# Patient Record
Sex: Female | Born: 1965 | Race: White | Hispanic: No | Smoking: Current every day smoker
Health system: Southern US, Community
[De-identification: ages and names within clinical notes are randomized; demographics above are authoritative.]

## PROBLEM LIST (undated history)

## (undated) DIAGNOSIS — Z8489 Family history of other specified conditions: Secondary | ICD-10-CM

## (undated) DIAGNOSIS — J449 Chronic obstructive pulmonary disease, unspecified: Secondary | ICD-10-CM

## (undated) DIAGNOSIS — K649 Unspecified hemorrhoids: Secondary | ICD-10-CM

## (undated) DIAGNOSIS — E079 Disorder of thyroid, unspecified: Secondary | ICD-10-CM

## (undated) DIAGNOSIS — J45909 Unspecified asthma, uncomplicated: Secondary | ICD-10-CM

## (undated) HISTORY — DX: Chronic obstructive pulmonary disease, unspecified: J44.9

## (undated) HISTORY — PX: TONSILLECTOMY: SUR1361

## (undated) HISTORY — DX: Unspecified hemorrhoids: K64.9

## (undated) HISTORY — DX: Unspecified asthma, uncomplicated: J45.909

## (undated) HISTORY — DX: Disorder of thyroid, unspecified: E07.9

---

## 2006-06-09 ENCOUNTER — Emergency Department: Payer: Self-pay | Admitting: Unknown Physician Specialty

## 2012-08-02 ENCOUNTER — Emergency Department: Payer: Self-pay | Admitting: Emergency Medicine

## 2012-08-02 LAB — CBC WITH DIFFERENTIAL/PLATELET
Eosinophil %: 0.5 %
Lymphocyte %: 18.3 %
MCH: 30.5 pg (ref 26.0–34.0)
MCV: 89 fL (ref 80–100)
Monocyte #: 0.1 x10 3/mm — ABNORMAL LOW (ref 0.2–0.9)
Monocyte %: 2.4 %
Neutrophil #: 4 10*3/uL (ref 1.4–6.5)
RBC: 5.14 10*6/uL (ref 3.80–5.20)
RDW: 14.4 % (ref 11.5–14.5)
WBC: 5.2 10*3/uL (ref 3.6–11.0)

## 2016-07-22 DIAGNOSIS — K649 Unspecified hemorrhoids: Secondary | ICD-10-CM

## 2016-07-22 HISTORY — DX: Unspecified hemorrhoids: K64.9

## 2017-02-26 ENCOUNTER — Emergency Department
Admission: EM | Admit: 2017-02-26 | Discharge: 2017-02-26 | Disposition: A | Discharge status: Home / Self Care | Payer: Self-pay | Attending: Emergency Medicine | Admitting: Emergency Medicine

## 2017-02-26 ENCOUNTER — Encounter: Payer: Self-pay | Admitting: *Deleted

## 2017-02-26 DIAGNOSIS — F17210 Nicotine dependence, cigarettes, uncomplicated: Secondary | ICD-10-CM | POA: Insufficient documentation

## 2017-02-26 DIAGNOSIS — K642 Third degree hemorrhoids: Secondary | ICD-10-CM | POA: Insufficient documentation

## 2017-02-26 MED ORDER — HYDROCORTISONE ACETATE 25 MG RE SUPP: 25.0000 mg | Freq: Two times a day (BID) | RECTAL | 1 refills | Status: DC

## 2017-02-26 MED ORDER — DIBUCAINE 1 % RE OINT: 1.0000 "application " | TOPICAL_OINTMENT | Freq: Three times a day (TID) | RECTAL | 0 refills | Status: DC | PRN

## 2017-02-26 MED ORDER — LIDOCAINE HCL 2 % EX GEL: 1.0000 "application " | Freq: Once | CUTANEOUS | Status: DC

## 2017-02-26 MED ORDER — HYDROCODONE-ACETAMINOPHEN 7.5-325 MG PO TABS
1.0000 | ORAL_TABLET | Freq: Once | ORAL | Status: AC
  Administered 2017-02-26: 1 via ORAL
  Filled 2017-02-26: qty 1

## 2017-02-26 MED ORDER — HYDROCORTISONE ACETATE 25 MG RE SUPP
25.0000 mg | Freq: Once | RECTAL | Status: AC
  Administered 2017-02-26: 25 mg via RECTAL
  Filled 2017-02-26: qty 1

## 2017-02-26 MED ORDER — HYDROCODONE-ACETAMINOPHEN 5-325 MG PO TABS: 1.0000 | ORAL_TABLET | Freq: Three times a day (TID) | ORAL | 0 refills | Status: DC | PRN

## 2017-02-26 MED ORDER — LIDOCAINE HCL 2 % EX GEL
CUTANEOUS | Status: AC
  Administered 2017-02-26: 11:00:00
  Filled 2017-02-26: qty 10

## 2017-02-26 NOTE — Discharge Instructions (Signed)
You have both internal and external hemorrhoids. Take the pain medicine as needed. Use the suppository as directed and the topical gel as directed. Continue with sitz baths and consider using a foot stool to raise the legs when on the toilet. Start a daily fiber supplement (Fibercon, Metamucil, etc) as well as daily Miralax (polyethylene glycol) to keep stools soft. Limit your time sitting on they toilet. Follow-up with Dr. Vicente Males for possible non-surgical options.

## 2017-02-26 NOTE — ED Provider Notes (Signed)
[facility] Emergency Department Provider Note ____________________________________________  Time seen: 1008  I have reviewed the triage vital signs and the nursing notes.  HISTORY  Chief Complaint  Hemorrhoids  HPI Dominique Avila is a 51 y.o. female resents to the ED for evaluation and management of hemorrhoids. Patient reports a current flare for hemorrhoids over the last several days. Over the last 20+ years she has attempted to manage her hemorrhoid pain with over-the-counter medications, sitz baths, and suppositories. She denies any meaningful, lasting benefit. She presents now with pain, pressure, and burning to the hemorrhoids. She also notes discomfort with trying to sit down. She denies any gross blood at this time. She is aware one hemorrhoid that is tender and looks to be inflamed. She denies any nausea, vomiting, or diarrhea. She does admit at times to straining to pass stool. She denies any other significant medical history denies any current medications and denies any medical allergies.  History reviewed. No pertinent past medical history.  There are no active problems to display for this patient.   History reviewed. No pertinent surgical history.  Prior to Admission medications   Medication Sig Start Date End Date Taking? Authorizing Provider  dibucaine (NUPERCAINAL) 1 % OINT Place 1 application rectally 3 (three) times daily as needed for hemorrhoids. 02/26/17   Carson Bogden, Dannielle Karvonen, PA-C  HYDROcodone-acetaminophen (NORCO) 5-325 MG tablet Take 1 tablet by mouth 3 (three) times daily as needed. 02/26/17   Chequita Mofield, Dannielle Karvonen, PA-C  hydrocortisone (ANUSOL-HC) 25 MG suppository Place 1 suppository (25 mg total) rectally every 12 (twelve) hours. 02/26/17 03/10/17  Brandyn Thien, Dannielle Karvonen, PA-C    Allergies Patient has no known allergies.  No family history on file.  Social History Social History  Substance Use Topics  . Smoking status: Current  Every Day Smoker  . Smokeless tobacco: Never Used  . Alcohol use No    Review of Systems  Constitutional: Negative for fever. Cardiovascular: Negative for chest pain. Respiratory: Negative for shortness of breath. Gastrointestinal: Negative for abdominal pain, vomiting and diarrhea. Rectal pain as above  Genitourinary: Negative for dysuria. Skin: Negative for rash. ____________________________________________  PHYSICAL EXAM:  VITAL SIGNS: ED Triage Vitals [02/26/17 0922]  Enc Vitals Group     BP (!) 136/92     Pulse Rate 80     Resp 18     Temp 97.8 F (36.6 C)     Temp Source Oral     SpO2 97 %     Weight 190 lb (86.2 kg)     Height 5\' 9"  (1.753 m)     Head Circumference      Peak Flow      Pain Score 10     Pain Loc      Pain Edu?      Excl. in [place]?     Constitutional: Alert and oriented. Well appearing and in no distress. Head: Normocephalic and atraumatic. Respiratory: Normal respiratory effort.  Gastrointestinal: Soft and nontender. No distention. Rectal exam reveals several enlarged external hemorrhoids. There is a single prolapsed internal hemorrhoid, that easily reduces. No thrombosed or incarcerated hemorrhoids noted.  Musculoskeletal: Nontender with normal range of motion in all extremities.  Neurologic:  Normal gait without ataxia. Normal speech and language. No gross focal neurologic deficits are appreciated. Skin:  Skin is warm, dry and intact. No rash noted. ____________________________________________  PROCEDURES  Norco 7.5-325 mg PO Lidocaine 2% jelly PR Hydrocortisone suppository 25 mg PR  ____________________________________________  INITIAL IMPRESSION / ASSESSMENT AND PLAN / ED COURSE  Patient was ED evaluation of internal grade 3 hemorrhoids and external hemorrhoids on examination. Patient is treated with topical lidocaine and then suppository is instilled in the ED. She'll be discharged with a prescription for hydrocodone 5-325,  hydrocortisone suppositories, and Nupercaine topical gel. He is referred to Dr. Vicente Males in GI, for further evaluation and possible nonsurgical intervention. She is given instruction on increasing her fiber intake as well as a daily stool softener. She should continue with her other remedies to help alleviate symptoms. ____________________________________________  FINAL CLINICAL IMPRESSION(S) / ED DIAGNOSES  Final diagnoses:  Grade III hemorrhoids      Carmie End, Dannielle Karvonen, PA-C 02/26/17 1141    Nance Pear, MD 02/27/17 [phone]

## 2017-02-26 NOTE — ED Triage Notes (Signed)
States a hemorrhoids that is burning, pt is unable to sit down due to pain

## 2017-03-07 ENCOUNTER — Ambulatory Visit (INDEPENDENT_AMBULATORY_CARE_PROVIDER_SITE_OTHER): Payer: Self-pay | Admitting: Gastroenterology

## 2017-03-07 ENCOUNTER — Telehealth: Payer: Self-pay

## 2017-03-07 ENCOUNTER — Encounter: Payer: Self-pay | Admitting: Gastroenterology

## 2017-03-07 ENCOUNTER — Other Ambulatory Visit: Payer: Self-pay

## 2017-03-07 ENCOUNTER — Other Ambulatory Visit
Admission: RE | Admit: 2017-03-07 | Discharge: 2017-03-07 | Disposition: A | Discharge status: Home / Self Care | Payer: Self-pay | Source: Ambulatory Visit | Attending: Gastroenterology | Admitting: Gastroenterology

## 2017-03-07 VITALS — BP 115/75 | HR 72 | Temp 98.2°F | Ht 69.0 in | Wt 184.2 lb

## 2017-03-07 DIAGNOSIS — Z1211 Encounter for screening for malignant neoplasm of colon: Secondary | ICD-10-CM | POA: Insufficient documentation

## 2017-03-07 DIAGNOSIS — K642 Third degree hemorrhoids: Secondary | ICD-10-CM

## 2017-03-07 DIAGNOSIS — K644 Residual hemorrhoidal skin tags: Secondary | ICD-10-CM | POA: Insufficient documentation

## 2017-03-07 DIAGNOSIS — K643 Fourth degree hemorrhoids: Secondary | ICD-10-CM

## 2017-03-07 DIAGNOSIS — K645 Perianal venous thrombosis: Secondary | ICD-10-CM

## 2017-03-07 DIAGNOSIS — F17210 Nicotine dependence, cigarettes, uncomplicated: Secondary | ICD-10-CM

## 2017-03-07 DIAGNOSIS — K648 Other hemorrhoids: Secondary | ICD-10-CM

## 2017-03-07 LAB — BASIC METABOLIC PANEL
Anion gap: 8 (ref 5–15)
BUN: 12 mg/dL (ref 6–20)
CHLORIDE: 107 mmol/L (ref 101–111)
CO2: 28 mmol/L (ref 22–32)
Calcium: 9 mg/dL (ref 8.9–10.3)
Creatinine, Ser: 0.75 mg/dL (ref 0.44–1.00)
GFR calc Af Amer: >60 mL/min (ref >60)
GFR calc non Af Amer: >60 mL/min (ref >60)
GLUCOSE: 95 mg/dL (ref 65–99)
POTASSIUM: 4 mmol/L (ref 3.5–5.1)
Sodium: 143 mmol/L (ref 135–145)

## 2017-03-07 LAB — CBC
HEMATOCRIT: 45.1 % (ref 35.0–47.0)
Hemoglobin: 15.4 g/dL (ref 12.0–16.0)
MCH: 31.5 pg (ref 26.0–34.0)
MCHC: 34.1 g/dL (ref 32.0–36.0)
MCV: 92.3 fL (ref 80.0–100.0)
Platelets: 244 10*3/uL (ref 150–440)
RBC: 4.88 MIL/uL (ref 3.80–5.20)
RDW: 14.5 % (ref 11.5–14.5)
WBC: 7.3 10*3/uL (ref 3.6–11.0)

## 2017-03-07 LAB — HEPATIC FUNCTION PANEL
ALBUMIN: 4.2 g/dL (ref 3.5–5.0)
ALK PHOS: 60 U/L (ref 38–126)
ALT: 12 U/L — ABNORMAL LOW (ref 14–54)
AST: 19 U/L (ref 15–41)
BILIRUBIN TOTAL: 0.8 mg/dL (ref 0.3–1.2)
Total Protein: 7.2 g/dL (ref 6.5–8.1)

## 2017-03-07 LAB — PROTIME-INR
INR: 0.97
Prothrombin Time: 12.9 seconds (ref 11.4–15.2)

## 2017-03-07 MED ORDER — LIDOCAINE 5 % EX OINT: 1.0000 "application " | TOPICAL_OINTMENT | Freq: Two times a day (BID) | CUTANEOUS | 0 refills | Status: DC | PRN

## 2017-03-07 MED ORDER — HYDROCORTISONE 1 % EX OINT: 1.0000 "application " | TOPICAL_OINTMENT | Freq: Two times a day (BID) | CUTANEOUS | 0 refills | Status: DC

## 2017-03-07 NOTE — Telephone Encounter (Signed)
Gastroenterology Pre-Procedure Review  Request Date: 09/12/1 Requesting Physician: Dr. Marius Ditch  PATIENT REVIEW QUESTIONS: The patient responded to the following health history questions as indicated:    1. Are you having any GI issues? yes (hemmrhoids) 2. Do you have a personal history of Polyps? no 3. Do you have a family history of Colon Cancer or Polyps? no 4. Diabetes Mellitus? no 5. Joint replacements in the past 12 months?no 6. Major health problems in the past 3 months?no 7. Any artificial heart valves, MVP, or defibrillator?no    MEDICATIONS & ALLERGIES:    Patient reports the following regarding taking any anticoagulation/antiplatelet therapy:   Plavix, Coumadin, Eliquis, Xarelto, Lovenox, Pradaxa, Brilinta, or Effient? no Aspirin? no  Patient confirms/reports the following medications:  Current Outpatient Prescriptions  Medication Sig Dispense Refill  . HYDROcodone-acetaminophen (NORCO) 5-325 MG tablet Take 1 tablet by mouth 3 (three) times daily as needed. 15 tablet 0  . hydrocortisone 1 % ointment Apply 1 application topically 2 (two) times daily. 30 g 0  . lidocaine (XYLOCAINE) 5 % ointment Apply 1 application topically 2 (two) times daily as needed. 35.44 g 0   No current facility-administered medications for this visit.     Patient confirms/reports the following allergies:  No Known Allergies  No orders of the defined types were placed in this encounter.   AUTHORIZATION INFORMATION Primary Insurance: 1D#: Group #:  Secondary Insurance: 1D#: Group #:  SCHEDULE INFORMATION: Date: 04/02/17 Time: Location:[facility]

## 2017-03-07 NOTE — Patient Instructions (Signed)
1. Schedule colonoscopy for colon cancer screening 2. Try hydrocortisone cream and lidocaine cream OTC, apply 2-3 times daily along with witchhazel pads and sitz baths with epsom salt 3. Will refer to colorectal surgeon after the colonoscopy  Please call our office to speak with my nurse Driscilla Grammes at [phone] during business hours from 8am to 4pm if you have any questions/concerns. During after hours, you will be redirected to on call GI physician. For any emergency please call 911 or go the nearest emergency room.    Dominique Darby, MD [address]  [place], [place] [address]  Main: [phone]  Fax: [phone]

## 2017-03-07 NOTE — Progress Notes (Signed)
Dominique Darby, MD [address]  [place]  [place], [place] [address]  Main: [phone]  Fax: [phone]    Gastroenterology Consultation  Referring Provider:     No ref. provider found Primary Care Physician:  Patient, No Pcp Per Primary Gastroenterologist:  Dr. Cephas Avila Reason for Consultation:     Hemorrhoids        HPI:   Dominique Avila is a 51 y.o. y/o female referred from emergency room for further evaluation of hemorrhoids. She has history of tobacco dependence, has been suffering from hemorrhoids for a long time. Recent worsening that prompted her to go to emergency room last week. She was found to have grade 3 internal and external hemorrhoids, discharged home on hydrocortisone suppository, lidocaine cream with follow-up in GI clinic. She reports having regular bowel movements, 2-3 per day not associated with gross bleeding. She reports only staining when she wipes. Her hemorrhoids have been terribly painful, unable to sedate, unable to wear tight garments. She has been doing sitz bath with Epsom salt, witch hazel pads, Preparation H but using suppository has been uncomfortable. She reports that her hemorrhoids have gotten worse in last 1 month. She denies having episodes of constipation, straining. She did not have a colonoscopy yet. She does not have any other medical problems. She denies any other GI symptoms. She is taking hydrocodone for pain prescribed by ER.  She denies family history of colon cancer, other GI malignancy. She has been smoking tobacco 1-2 packs per day since age of 61. She denies drinking alcohol.  GI Procedures: None  No past medical history on file.  No past surgical history on file.  Prior to Admission medications   Medication Sig Start Date End Date Taking? Authorizing Provider  dibucaine (NUPERCAINAL) 1 % OINT Place 1 application rectally 3 (three) times daily as needed for hemorrhoids. 02/26/17  Yes Avila, Dominique Karvonen, PA-C    HYDROcodone-acetaminophen (NORCO) 5-325 MG tablet Take 1 tablet by mouth 3 (three) times daily as needed. 02/26/17  Yes Avila, Dominique Karvonen, PA-C  hydrocortisone (ANUSOL-HC) 25 MG suppository Place 1 suppository (25 mg total) rectally every 12 (twelve) hours. 02/26/17 03/10/17 Yes Avila, Dominique Karvonen, PA-C    No family history on file.   Social History  Substance Use Topics  . Smoking status: Current Every Day Smoker  . Smokeless tobacco: Never Used  . Alcohol use No    Allergies as of 03/07/2017  . (No Known Allergies)    Review of Systems:    All systems reviewed and negative except where noted in HPI.   Physical Exam:  BP 115/75   Pulse 72   Temp 98.2 F (36.8 C) (Oral)   Ht 5\' 9"  (1.753 m)   Wt 83.6 kg (184 lb 3.2 oz)   BMI 27.20 kg/m  No LMP recorded. Patient is postmenopausal.  General:   Alert,  Well-developed, well-nourished, pleasant and cooperative in NAD Head:  Normocephalic and atraumatic. Eyes:  Sclera clear, no icterus.   Conjunctiva pink. Ears:  Normal auditory acuity. Nose:  No deformity, discharge, or lesions. Mouth:  No deformity or lesions,oropharynx pink & moist. Neck:  Supple; no masses or thyromegaly. Lungs:  Respirations even and unlabored.  Clear throughout to auscultation.   No wheezes, crackles, or rhonchi. No acute distress. Heart:  Regular rate and rhythm; no murmurs, clicks, rubs, or gallops. Abdomen:  Normal bowel sounds.  No bruits.  Soft, non-tender and non-distended without masses, hepatosplenomegaly  or hernias noted.  No guarding or rebound tenderness.    Rectum: Grade 4 internal and external hemorrhoids, one of the external hemorrhoid had an old clot and this was nontender. She does have an ulcerated hemorrhoid in the Center of the anal canal which is slightly tender. Digital rectal exam was not performed. Msk:  Symmetrical without gross deformities. Good, equal movement & strength bilaterally. Pulses:  Normal pulses  noted. Extremities:  No clubbing or edema.  No cyanosis. Neurologic:  Alert and oriented x3;  grossly normal neurologically. Skin:  Intact without significant lesions or rashes. No jaundice. Lymph Nodes:  No significant cervical adenopathy. Psych:  Alert and cooperative. Normal mood and affect.  Imaging Studies: None  Assessment and Plan:   Kerah Hardebeck is a 51 y.o. y/o female with tobacco abuse, grade 4 internal and external hemorrhoids. She is also due for colon cancer screening. Before I referred her to colorectal surgeons for hemorrhoidectomy I would like to perform colonoscopy to rule out any malignancy and anal canal cancer as one of the ulcerated lesions that I saw on exam today appeared concerning. She understands the risks and benefits of colonoscopy and is agreeable with the plan.  1. Schedule colonoscopy for colon cancer screening 2. Try hydrocortisone cream and lidocaine cream OTC, apply 2-3 times daily along with witchhazel pads and sitz baths with epsom salt 3. Will refer to colorectal surgeon after the colonoscopy 4. Encouraged her to quit smoking  Follow up based on the colonoscopy results   Dominique Darby, MD

## 2017-04-02 ENCOUNTER — Ambulatory Visit: Payer: Self-pay | Admitting: Anesthesiology

## 2017-04-02 ENCOUNTER — Ambulatory Visit
Admission: RE | Admit: 2017-04-02 | Discharge: 2017-04-02 | Disposition: A | Discharge status: Home / Self Care | Payer: Self-pay | Source: Ambulatory Visit | Attending: Gastroenterology | Admitting: Gastroenterology

## 2017-04-02 ENCOUNTER — Encounter
Admission: RE | Disposition: A | Discharge status: Home / Self Care | Payer: Self-pay | Source: Ambulatory Visit | Attending: Gastroenterology

## 2017-04-02 DIAGNOSIS — K643 Fourth degree hemorrhoids: Secondary | ICD-10-CM

## 2017-04-02 DIAGNOSIS — D123 Benign neoplasm of transverse colon: Secondary | ICD-10-CM | POA: Insufficient documentation

## 2017-04-02 DIAGNOSIS — F17200 Nicotine dependence, unspecified, uncomplicated: Secondary | ICD-10-CM | POA: Insufficient documentation

## 2017-04-02 DIAGNOSIS — K648 Other hemorrhoids: Secondary | ICD-10-CM | POA: Insufficient documentation

## 2017-04-02 DIAGNOSIS — D122 Benign neoplasm of ascending colon: Secondary | ICD-10-CM | POA: Insufficient documentation

## 2017-04-02 DIAGNOSIS — Z833 Family history of diabetes mellitus: Secondary | ICD-10-CM | POA: Insufficient documentation

## 2017-04-02 DIAGNOSIS — Z1211 Encounter for screening for malignant neoplasm of colon: Secondary | ICD-10-CM

## 2017-04-02 DIAGNOSIS — K644 Residual hemorrhoidal skin tags: Secondary | ICD-10-CM | POA: Insufficient documentation

## 2017-04-02 HISTORY — PX: COLONOSCOPY WITH PROPOFOL: SHX5780

## 2017-04-02 SURGERY — COLONOSCOPY WITH PROPOFOL
Anesthesia: General

## 2017-04-02 MED ORDER — ONDANSETRON HCL 4 MG/2ML IJ SOLN
INTRAMUSCULAR | Status: DC | PRN
  Administered 2017-04-02: 4 mg via INTRAVENOUS

## 2017-04-02 MED ORDER — GLUCAGON HCL RDNA (DIAGNOSTIC) 1 MG IJ SOLR
INTRAMUSCULAR | Status: AC
  Administered 2017-04-02: 10:00:00
  Filled 2017-04-02: qty 1

## 2017-04-02 MED ORDER — PROPOFOL 10 MG/ML IV BOLUS
INTRAVENOUS | Status: DC | PRN
  Administered 2017-04-02: 70 mg via INTRAVENOUS

## 2017-04-02 MED ORDER — LIDOCAINE HCL (CARDIAC) 20 MG/ML IV SOLN
INTRAVENOUS | Status: DC | PRN
  Administered 2017-04-02: 40 mg via INTRAVENOUS

## 2017-04-02 MED ORDER — METHYLENE BLUE 1 % INJ SOLN
INTRAMUSCULAR | Status: AC
  Administered 2017-04-02: 10:00:00
  Filled 2017-04-02: qty 10

## 2017-04-02 MED ORDER — PROPOFOL 10 MG/ML IV BOLUS
INTRAVENOUS | Status: AC
  Filled 2017-04-02: qty 20

## 2017-04-02 MED ORDER — PROPOFOL 500 MG/50ML IV EMUL
INTRAVENOUS | Status: DC | PRN
  Administered 2017-04-02: 150 ug/kg/min via INTRAVENOUS

## 2017-04-02 MED ORDER — LIDOCAINE HCL (PF) 2 % IJ SOLN
INTRAMUSCULAR | Status: AC
  Filled 2017-04-02: qty 2

## 2017-04-02 MED ORDER — ONDANSETRON HCL 4 MG/2ML IJ SOLN
INTRAMUSCULAR | Status: AC
  Filled 2017-04-02: qty 2

## 2017-04-02 MED ORDER — PROPOFOL 500 MG/50ML IV EMUL
INTRAVENOUS | Status: AC
  Filled 2017-04-02: qty 50

## 2017-04-02 MED ORDER — SODIUM CHLORIDE 0.9 % IV SOLN
INTRAVENOUS | Status: DC
  Administered 2017-04-02: 09:00:00 via INTRAVENOUS

## 2017-04-02 NOTE — Op Note (Signed)
[facility] Gastroenterology Patient Name: Dominique Avila Procedure Date: 04/02/2017 9:14 AM MRN: [id number] Account #: [id number] Date of Birth: Sep 02, 1965 Admit Type: Outpatient Age: 51 Room: [facility] ENDO ROOM 4 Gender: Female Note Status: Finalized Procedure:            Colonoscopy Indications:          Screening for colorectal malignant neoplasm, This is                        the patient's first colonoscopy Providers:            Lin Landsman MD, MD Medicines:            Monitored Anesthesia Care Complications:        No immediate complications. Estimated blood loss: None. Procedure:            Pre-Anesthesia Assessment:                       - Prior to the procedure, a History and Physical was                        performed, and patient medications and allergies were                        reviewed. The patient is competent. The risks and                        benefits of the procedure and the sedation options and                        risks were discussed with the patient. All questions                        were answered and informed consent was obtained.                        Patient identification and proposed procedure were                        verified by the physician, the nurse, the                        anesthesiologist, the anesthetist and the technician in                        the pre-procedure area in the procedure room. Mental                        Status Examination: alert and oriented. Airway                        Examination: normal oropharyngeal airway and neck                        mobility. Respiratory Examination: clear to                        auscultation. CV Examination: normal. Prophylactic                        Antibiotics:  The patient does not require prophylactic                        antibiotics. Prior Anticoagulants: The patient has                        taken no previous anticoagulant or antiplatelet agents.                       ASA Grade Assessment: II - A patient with mild systemic                        disease. After reviewing the risks and benefits, the                        patient was deemed in satisfactory condition to undergo                        the procedure. The anesthesia plan was to use monitored                        anesthesia care (MAC). Immediately prior to                        administration of medications, the patient was                        re-assessed for adequacy to receive sedatives. The                        heart rate, respiratory rate, oxygen saturations, blood                        pressure, adequacy of pulmonary ventilation, and                        response to care were monitored throughout the                        procedure. The physical status of the patient was                        re-assessed after the procedure.                       After obtaining informed consent, the colonoscope was                        passed under direct vision. Throughout the procedure,                        the patient's blood pressure, pulse, and oxygen                        saturations were monitored continuously. The Olympus                        CF-H180AL colonoscope ( S#: [id number] ) was introduced                        through the  anus and advanced to the the cecum,                        identified by appendiceal orifice and ileocecal valve.                        The colonoscopy was performed without difficulty. The                        patient tolerated the procedure well. The quality of                        the bowel preparation was evaluated using the BBPS                        [facility] Bowel Preparation Scale) with scores of: Right                        Colon = 3, Transverse Colon = 3 and Left Colon = 3                        (entire mucosa seen well with no residual staining,                        small fragments of stool or opaque liquid). The  total                        BBPS score equals 9. Findings:      The perianal exam findings include non-thrombosed external hemorrhoids       and non-thrombosed internal hemorrhoids.      A 5 mm polyp was found in the transverse colon. The polyp was sessile.       The polyp was removed with a cold snare. Resection and retrieval were       complete.      A 20 mm polyp was found in the proximal ascending colon. The polyp was       flat. Preparations were made for mucosal resection. Chromoscopy with       methylene blue was done to mark the borders of the lesion. Methylene       blue was injected with adequate lift of the lesion from the muscularis       propria. Snare mucosal resection with suction (via the working channel)       retrieval was performed. A 20 mm area was resected. Resection and       retrieval were complete. There was no bleeding during, and at the end,       of the procedure. To prevent bleeding after mucosal resection, two       hemostatic clips were successfully placed. There was no bleeding during,       or at the end, of the procedure.      Non-bleeding external internal hemorrhoids were found during       retroflexion. The hemorrhoids were large. Impression:           - Non-thrombosed external hemorrhoids and                        non-thrombosed internal hemorrhoids found on perianal  exam.                       - One 5 mm polyp in the transverse colon, removed with                        a cold snare. Resected and retrieved.                       - One 25 mm polyp in the proximal ascending colon,                        removed with mucosal resection. Resected and retrieved.                        Clips were placed.                       - Non-bleeding external internal hemorrhoids.                       - Mucosal resection was performed. Resection and                        retrieval were complete. Recommendation:       - Repeat colonoscopy  in 6 months for surveillance after                        piecemeal polypectomy.                       - Discharge patient to home.                       - Resume regular diet today.                       - Continue present medications.                       - Await pathology results. Procedure Code(s):    --- Professional ---                       [phone], 58, Colonoscopy, flexible; with endoscopic                        mucosal resection                       [phone], Colonoscopy, flexible; with removal of tumor(s),                        polyp(s), or other lesion(s) by snare technique Diagnosis Code(s):    --- Professional ---                       Z12.11, Encounter for screening for malignant neoplasm                        of colon                       D12.3, Benign neoplasm of transverse colon (hepatic  flexure or splenic flexure)                       D12.2, Benign neoplasm of ascending colon                       K64.4, Residual hemorrhoidal skin tags                       K64.8, Other hemorrhoids CPT copyright 2016 American Medical Association. All rights reserved. The codes documented in this report are preliminary and upon coder review may  be revised to meet current compliance requirements. Dr. Ulyess Mort Lin Landsman MD, MD 04/02/2017 10:38:52 AM This report has been signed electronically. Number of Addenda: 0 Note Initiated On: 04/02/2017 9:14 AM Scope Withdrawal Time: 1 hour 4 minutes 31 seconds  Total Procedure Duration: 1 hour 10 minutes 47 seconds       [facility]

## 2017-04-02 NOTE — Anesthesia Preprocedure Evaluation (Signed)
Anesthesia Evaluation  Patient identified by MRN, date of birth, ID band Patient awake    Reviewed: Allergy & Precautions, H&P , NPO status , Patient's Chart, lab work & pertinent test results  History of Anesthesia Complications Negative for: history of anesthetic complications  Airway Mallampati: III  TM Distance: <3 FB Neck ROM: limited    Dental  (+) Poor Dentition, Chipped, Missing, Edentulous Upper   Pulmonary neg shortness of breath, COPD, Current Smoker,           Cardiovascular Exercise Tolerance: Good (-) angina(-) Past MI and (-) DOE      Neuro/Psych negative neurological ROS  negative psych ROS   GI/Hepatic negative GI ROS, Neg liver ROS, neg GERD  ,  Endo/Other  negative endocrine ROS  Renal/GU negative Renal ROS  negative genitourinary   Musculoskeletal   Abdominal   Peds  Hematology negative hematology ROS (+)   Anesthesia Other Findings History reviewed. No pertinent past medical history.  History reviewed. No pertinent surgical history.     Reproductive/Obstetrics negative OB ROS                             Anesthesia Physical Anesthesia Plan  ASA: III  Anesthesia Plan: General   Post-op Pain Management:    Induction: Intravenous  PONV Risk Score and Plan: Propofol infusion  Airway Management Planned: Natural Airway and Nasal Cannula  Additional Equipment:   Intra-op Plan:   Post-operative Plan:   Informed Consent: I have reviewed the patients History and Physical, chart, labs and discussed the procedure including the risks, benefits and alternatives for the proposed anesthesia with the patient or authorized representative who has indicated his/her understanding and acceptance.   Dental Advisory Given  Plan Discussed with: Anesthesiologist, CRNA and Surgeon  Anesthesia Plan Comments: (Patient consented for risks of anesthesia including but not  limited to:  - adverse reactions to medications - risk of intubation if required - damage to teeth, lips or other oral mucosa - sore throat or hoarseness - Damage to heart, brain, lungs or loss of life  Patient voiced understanding.)        Anesthesia Quick Evaluation

## 2017-04-02 NOTE — Anesthesia Post-op Follow-up Note (Signed)
Anesthesia QCDR form completed.        

## 2017-04-02 NOTE — H&P (Signed)
  Cephas Darby, MD [address]  [place]  [place], [place] [address]  Main: [phone]  Fax: [phone] Pager: [phone]  Primary Care Physician:  Patient, No Pcp Per Primary Gastroenterologist:  Dr. Cephas Darby  Pre-Procedure History & Physical: HPI:  Dominique Avila is a 51 y.o. female is here for an colonoscopy.   History reviewed. No pertinent past medical history.  History reviewed. No pertinent surgical history.  Prior to Admission medications   Medication Sig Start Date End Date Taking? Authorizing Provider  HYDROcodone-acetaminophen (NORCO) 5-325 MG tablet Take 1 tablet by mouth 3 (three) times daily as needed. 02/26/17  Yes Menshew, Dannielle Karvonen, PA-C  hydrocortisone 1 % ointment Apply 1 application topically 2 (two) times daily. Patient not taking: Reported on 04/02/2017 03/07/17   Lin Landsman, MD  lidocaine (XYLOCAINE) 5 % ointment Apply 1 application topically 2 (two) times daily as needed. Patient not taking: Reported on 04/02/2017 03/07/17   Lin Landsman, MD    Allergies as of 03/07/2017  . (No Known Allergies)    Family History  Problem Relation Age of Onset  . Diabetes Father     Social History   Social History  . Marital status: Married    Spouse name: N/A  . Number of children: N/A  . Years of education: N/A   Occupational History  . Not on file.   Social History Main Topics  . Smoking status: Current Every Day Smoker    Packs/day: 2.00  . Smokeless tobacco: Never Used  . Alcohol use No  . Drug use: No  . Sexual activity: Not on file   Other Topics Concern  . Not on file   Social History Narrative  . No narrative on file    Review of Systems: See HPI, otherwise negative ROS  Physical Exam: There were no vitals taken for this visit. General:   Alert,  pleasant and cooperative in NAD Head:  Normocephalic and atraumatic. Neck:  Supple; no masses or thyromegaly. Lungs:  Clear throughout to auscultation.      Heart:  Regular rate and rhythm. Abdomen:  Soft, nontender and nondistended. Normal bowel sounds, without guarding, and without rebound.   Neurologic:  Alert and  oriented x4;  grossly normal neurologically.  Impression/Plan: Dominique Avila is here for an colonoscopy to be performed for colon cancer screening Risks, benefits, limitations, and alternatives regarding  colonoscopy have been reviewed with the patient.  Questions have been answered.  All parties agreeable.   Sherri Sear, MD  04/02/2017, 8:45 AM

## 2017-04-02 NOTE — Anesthesia Procedure Notes (Signed)
Performed by: Jehan Ranganathan Pre-anesthesia Checklist: Patient identified, Emergency Drugs available, Suction available, Patient being monitored and Timeout performed Patient Re-evaluated:Patient Re-evaluated prior to induction Oxygen Delivery Method: Nasal cannula Preoxygenation: Pre-oxygenation with 100% oxygen Induction Type: IV induction       

## 2017-04-02 NOTE — Transfer of Care (Signed)
Immediate Anesthesia Transfer of Care Note  Patient: Dominique Avila  Procedure(s) Performed: Procedure(s): COLONOSCOPY WITH PROPOFOL (N/A)  Patient Location: PACU  Anesthesia Type:General  Level of Consciousness: sedated and responds to stimulation  Airway & Oxygen Therapy: Patient Spontanous Breathing and Patient connected to nasal cannula oxygen  Post-op Assessment: Report given to RN and Post -op Vital signs reviewed and stable  Post vital signs: Reviewed and stable  Last Vitals:  Vitals:   04/02/17 0847 04/02/17 1032  BP: 127/84 112/74  Pulse: 76 71  Resp: 20 (!) 30  Temp: 36.7 C   SpO2: 98% 100%    Last Pain:  Vitals:   04/02/17 0847  TempSrc: Tympanic         Complications: No apparent anesthesia complications

## 2017-04-02 NOTE — Anesthesia Postprocedure Evaluation (Signed)
Anesthesia Post Note  Patient: Dominique Avila  Procedure(s) Performed: Procedure(s) (LRB): COLONOSCOPY WITH PROPOFOL (N/A)  Patient location during evaluation: Endoscopy Anesthesia Type: General Level of consciousness: awake and alert Pain management: pain level controlled Vital Signs Assessment: post-procedure vital signs reviewed and stable Respiratory status: spontaneous breathing, nonlabored ventilation, respiratory function stable and patient connected to nasal cannula oxygen Cardiovascular status: blood pressure returned to baseline and stable Postop Assessment: no signs of nausea or vomiting Anesthetic complications: no     Last Vitals:  Vitals:   04/02/17 1050 04/02/17 1100  BP: 126/84 133/86  Pulse: 61 65  Resp: 16 16  Temp:    SpO2: 100% 100%    Last Pain:  Vitals:   04/02/17 1030  TempSrc: Tympanic                 Precious Haws Shevon Sian

## 2017-04-03 ENCOUNTER — Telehealth: Payer: Self-pay

## 2017-04-03 ENCOUNTER — Encounter: Payer: Self-pay | Admitting: Gastroenterology

## 2017-04-03 LAB — SURGICAL PATHOLOGY

## 2017-04-03 NOTE — Telephone Encounter (Signed)
Pt has been asked to contact office in 4 months to schedule repeat colonoscopy at month 6.  Thanks [employer]

## 2017-04-03 NOTE — Telephone Encounter (Signed)
Pt has been mailed  her pathology results Notified her that the polyp pathology came back Benign, and there is no cancer. However, recommend repeat colonoscopy in 6 months to locate the area of prior polypectomy in right colon where I removed the large polyp and remove if there is any residual polyp.  Thanks [employer]

## 2017-04-23 ENCOUNTER — Ambulatory Visit: Payer: Self-pay

## 2017-04-23 NOTE — Telephone Encounter (Signed)
LVM for patient to contact office. Thanks [employer]

## 2017-06-02 ENCOUNTER — Ambulatory Visit: Payer: Self-pay | Admitting: Gastroenterology

## 2017-08-04 ENCOUNTER — Ambulatory Visit: Payer: Self-pay | Admitting: Gastroenterology

## 2017-08-08 ENCOUNTER — Encounter: Payer: Self-pay | Admitting: Gastroenterology

## 2017-08-08 ENCOUNTER — Ambulatory Visit: Payer: Self-pay | Admitting: Gastroenterology

## 2017-08-08 ENCOUNTER — Encounter (INDEPENDENT_AMBULATORY_CARE_PROVIDER_SITE_OTHER): Payer: Self-pay

## 2017-08-08 ENCOUNTER — Other Ambulatory Visit: Payer: Self-pay

## 2017-08-08 VITALS — BP 128/85 | HR 66 | Temp 97.9°F | Ht 69.0 in | Wt 181.0 lb

## 2017-08-08 DIAGNOSIS — K644 Residual hemorrhoidal skin tags: Secondary | ICD-10-CM

## 2017-08-08 DIAGNOSIS — K5730 Diverticulosis of large intestine without perforation or abscess without bleeding: Secondary | ICD-10-CM

## 2017-08-08 DIAGNOSIS — K641 Second degree hemorrhoids: Secondary | ICD-10-CM

## 2017-08-08 NOTE — Progress Notes (Signed)
PROCEDURE NOTE: The patient presents with symptomatic grade 2 hemorrhoids, unresponsive to maximal medical therapy, requesting rubber band ligation of his/her hemorrhoidal disease.  All risks, benefits and alternative forms of therapy were described and informed consent was obtained.  In the Left Lateral Decubitus position (if anoscopy is performed) anoscopic examination revealed grade 3 hemorrhoids in the RA position(s).   The decision was made to band the RA internal hemorrhoid, and the [place] was used to perform band ligation without complication.  Digital anorectal examination was then performed to assure proper positioning of the band, and to adjust the banded tissue as required.  The patient was discharged home without pain or other issues.  Dietary and behavioral recommendations were given and (if necessary - prescriptions were given), along with follow-up instructions.  The patient will return 2 weeks for follow-up and possible additional banding as required.  No complications were encountered and the patient tolerated the procedure well.  Cephas Darby, MD [address]  [place]  [place], [place] [address]  Main: [phone]  Fax: [phone] Pager: [phone]

## 2017-08-22 ENCOUNTER — Ambulatory Visit: Payer: Self-pay | Admitting: Gastroenterology

## 2017-09-12 ENCOUNTER — Ambulatory Visit: Payer: Self-pay | Admitting: Gastroenterology

## 2017-09-12 ENCOUNTER — Encounter (INDEPENDENT_AMBULATORY_CARE_PROVIDER_SITE_OTHER): Payer: Self-pay

## 2017-09-12 DIAGNOSIS — K644 Residual hemorrhoidal skin tags: Secondary | ICD-10-CM

## 2017-09-12 NOTE — Progress Notes (Signed)
PROCEDURE NOTE: The patient presents with symptomatic grade 3 hemorrhoids, unresponsive to maximal medical therapy, requesting rubber band ligation of his/her hemorrhoidal disease.  All risks, benefits and alternative forms of therapy were described and informed consent was obtained.   The decision was made to band the RP internal hemorrhoid, and the [facility] System was used to perform band ligation without complication.  Digital anorectal examination was then performed to assure proper positioning of the band, and to adjust the banded tissue as required.  The patient was discharged home without pain or other issues.  Dietary and behavioral recommendations were given and (if necessary - prescriptions were given), along with follow-up instructions.  The patient will return 2 weeks for follow-up and possible additional banding as required.  No complications were encountered and the patient tolerated the procedure well.  Rohini R Vanga, MD [address]  [address]  [place], [place] [address]  Main: [phone]  Fax: [phone] Pager: [phone]    

## 2017-09-26 ENCOUNTER — Encounter: Payer: Self-pay | Admitting: Gastroenterology

## 2017-09-26 ENCOUNTER — Ambulatory Visit: Payer: Self-pay | Admitting: Gastroenterology

## 2017-09-26 VITALS — BP 121/74 | HR 70 | Ht 69.0 in | Wt 185.0 lb

## 2017-09-26 DIAGNOSIS — K644 Residual hemorrhoidal skin tags: Secondary | ICD-10-CM

## 2017-09-26 NOTE — Progress Notes (Signed)
PROCEDURE NOTE: The patient presents with symptomatic grade 3 hemorrhoids, unresponsive to maximal medical therapy, requesting rubber band ligation of his/her hemorrhoidal disease.  All risks, benefits and alternative forms of therapy were described and informed consent was obtained.  The decision was made to band the LL internal hemorrhoid, and the [place] was used to perform band ligation without complication.  Digital anorectal examination was then performed to assure proper positioning of the band, and to adjust the banded tissue as required.  The patient was discharged home without pain or other issues.  Dietary and behavioral recommendations were given and (if necessary - prescriptions were given), along with follow-up instructions.  The patient will return  as needed for follow-up and possible additional banding as required.  No complications were encountered and the patient tolerated the procedure well.  Cephas Darby, MD [address]  [place]  [place], [place] [address]  Main: [phone]  Fax: [phone] Pager: [phone]

## 2017-10-08 ENCOUNTER — Telehealth: Payer: Self-pay

## 2017-10-08 NOTE — Telephone Encounter (Signed)
Patient contacted office to resschedule her colonoscopy from tomorrow to the end of May.  Stated she has a lot of things going on at this time.  Contacted Almyra Free and rescheduled patients colonoscopy to May 25th.  Thanks [employer]

## 2017-12-03 ENCOUNTER — Telehealth: Payer: Self-pay

## 2017-12-03 ENCOUNTER — Other Ambulatory Visit: Payer: Self-pay

## 2017-12-03 NOTE — Telephone Encounter (Signed)
Patient was contacted to clarify/reschedule her colonoscopy(Saturday) date.  She stated that she was going to call anyway to reschedule.  She has been asked to reschedule to July 25th because her job is getting busy with colleges letting out.  I've contacted Trish to let her know.  Thanks [employer]

## 2018-02-11 ENCOUNTER — Telehealth: Payer: Self-pay | Admitting: Gastroenterology

## 2018-02-11 NOTE — Telephone Encounter (Signed)
Per Wannetta Sender, patient needs to reschedule her procedure

## 2018-02-12 ENCOUNTER — Ambulatory Visit: Admission: RE | Admit: 2018-02-12 | Payer: Self-pay | Source: Ambulatory Visit | Admitting: Gastroenterology

## 2018-02-12 ENCOUNTER — Other Ambulatory Visit: Payer: Self-pay

## 2018-02-12 ENCOUNTER — Encounter: Admission: RE | Payer: Self-pay | Source: Ambulatory Visit

## 2018-02-12 DIAGNOSIS — Z1211 Encounter for screening for malignant neoplasm of colon: Secondary | ICD-10-CM

## 2018-02-12 SURGERY — COLONOSCOPY WITH PROPOFOL
Anesthesia: General

## 2018-02-12 NOTE — Telephone Encounter (Signed)
Rescheduled colonoscopy to 03/26/18.  Patient still has her instructions.  Will update referral.  Thanks Sharyn Lull

## 2018-03-16 ENCOUNTER — Emergency Department
Admission: EM | Admit: 2018-03-16 | Discharge: 2018-03-16 | Disposition: A | Discharge status: Home / Self Care | Payer: Self-pay | Attending: Emergency Medicine | Admitting: Emergency Medicine

## 2018-03-16 ENCOUNTER — Emergency Department: Payer: Self-pay

## 2018-03-16 ENCOUNTER — Other Ambulatory Visit: Payer: Self-pay

## 2018-03-16 ENCOUNTER — Encounter: Payer: Self-pay | Admitting: Emergency Medicine

## 2018-03-16 DIAGNOSIS — Y929 Unspecified place or not applicable: Secondary | ICD-10-CM | POA: Insufficient documentation

## 2018-03-16 DIAGNOSIS — Y999 Unspecified external cause status: Secondary | ICD-10-CM | POA: Insufficient documentation

## 2018-03-16 DIAGNOSIS — X509XXA Other and unspecified overexertion or strenuous movements or postures, initial encounter: Secondary | ICD-10-CM | POA: Insufficient documentation

## 2018-03-16 DIAGNOSIS — M5416 Radiculopathy, lumbar region: Secondary | ICD-10-CM | POA: Insufficient documentation

## 2018-03-16 DIAGNOSIS — Y939 Activity, unspecified: Secondary | ICD-10-CM | POA: Insufficient documentation

## 2018-03-16 DIAGNOSIS — M5410 Radiculopathy, site unspecified: Secondary | ICD-10-CM

## 2018-03-16 DIAGNOSIS — F17210 Nicotine dependence, cigarettes, uncomplicated: Secondary | ICD-10-CM | POA: Insufficient documentation

## 2018-03-16 MED ORDER — TRAMADOL HCL 50 MG PO TABS: 50.0000 mg | ORAL_TABLET | Freq: Four times a day (QID) | ORAL | 0 refills | Status: DC | PRN

## 2018-03-16 MED ORDER — HYDROMORPHONE HCL 1 MG/ML IJ SOLN
1.0000 mg | Freq: Once | INTRAMUSCULAR | Status: AC
  Administered 2018-03-16: 1 mg via INTRAMUSCULAR
  Filled 2018-03-16: qty 1

## 2018-03-16 MED ORDER — ORPHENADRINE CITRATE 30 MG/ML IJ SOLN
60.0000 mg | Freq: Two times a day (BID) | INTRAMUSCULAR | Status: DC
  Administered 2018-03-16: 60 mg via INTRAMUSCULAR
  Filled 2018-03-16: qty 2

## 2018-03-16 MED ORDER — CYCLOBENZAPRINE HCL 10 MG PO TABS: 10.0000 mg | ORAL_TABLET | Freq: Three times a day (TID) | ORAL | 0 refills | Status: DC | PRN

## 2018-03-16 NOTE — ED Provider Notes (Signed)
[facility] Emergency Department Provider Note   ____________________________________________   First MD Initiated Contact with Patient 03/16/18 [phone]     (approximate)  I have reviewed the triage vital signs and the nursing notes.   HISTORY  Chief Complaint Back Pain    HPI Dominique Avila is a 52 y.o. female patient complain of acute onset of radicular back pain to the left lower extremity.  Onset yesterday.  Patient denies traumatic event for complaint.  Patient denies bladder bowel dysfunction.  Patient rates the pain as a 10/10.  Patient is a refractory to NSAIDs and lidocaine patches.  Patient state pain increases with sitting.  History reviewed. No pertinent past medical history.  Patient Active Problem List   Diagnosis Date Noted  . Colon cancer screening   . Hemorrhoids 03/07/2017  . Grade IV internal hemorrhoids 03/07/2017  . Smoking greater than 30 pack years 03/07/2017    Past Surgical History:  Procedure Laterality Date  . COLONOSCOPY WITH PROPOFOL N/A 04/02/2017   Procedure: COLONOSCOPY WITH PROPOFOL;  Surgeon: Lin Landsman, MD;  Location: [facility] ENDOSCOPY;  Service: Gastroenterology;  Laterality: N/A;    Prior to Admission medications   Medication Sig Start Date End Date Taking? Authorizing Provider  cyclobenzaprine (FLEXERIL) 10 MG tablet Take 1 tablet (10 mg total) by mouth 3 (three) times daily as needed. 03/16/18   Sable Feil, PA-C  hydrocortisone 1 % ointment Apply 1 application topically 2 (two) times daily. Patient not taking: Reported on 04/02/2017 03/07/17   Lin Landsman, MD  lidocaine (XYLOCAINE) 5 % ointment Apply 1 application topically 2 (two) times daily as needed. Patient not taking: Reported on 04/02/2017 03/07/17   Lin Landsman, MD  traMADol (ULTRAM) 50 MG tablet Take 1 tablet (50 mg total) by mouth every 6 (six) hours as needed. 03/16/18 03/16/19  Sable Feil, PA-C    Allergies Patient has no  known allergies.  Family History  Problem Relation Age of Onset  . Diabetes Father     Social History Social History   Tobacco Use  . Smoking status: Current Every Day Smoker    Packs/day: 2.00  . Smokeless tobacco: Never Used  Substance Use Topics  . Alcohol use: No  . Drug use: No    Review of Systems Constitutional: No fever/chills Eyes: No visual changes. ENT: No sore throat. Cardiovascular: Denies chest pain. Respiratory: Denies shortness of breath. Gastrointestinal: No abdominal pain.  No nausea, no vomiting.  No diarrhea.  No constipation. Genitourinary: Negative for dysuria. Musculoskeletal: Positive for back pain. Skin: Negative for rash. Neurological: Negative for headaches, focal weakness or numbness.   ____________________________________________   PHYSICAL EXAM:  VITAL SIGNS: ED Triage Vitals [03/16/18 0747]  Enc Vitals Group     BP (!) 149/88     Pulse Rate 74     Resp 18     Temp 97.8 F (36.6 C)     Temp Source Oral     SpO2 98 %     Weight 195 lb (88.5 kg)     Height 5\' 9"  (1.753 m)     Head Circumference      Peak Flow      Pain Score 10     Pain Loc      Pain Edu?      Excl. in [place]?    Constitutional: Alert and oriented. Neck: No cervical spine tenderness to palpation. Hematological/Lymphatic/Immunilogical: No cervical lymphadenopathy. Cardiovascular: Normal rate, regular rhythm. Grossly normal  heart sounds.  Good peripheral circulation. Respiratory: Normal respiratory effort.  No retractions. Lungs CTAB. Gastrointestinal: Soft and nontender. No distention. No abdominal bruits. No CVA tenderness. Musculoskeletal: No obvious deformity lumbar spine.  Patient has decreased range of motion with flexion and extension.  Patient has bilateral paraspinal muscle spasms.  Patient had negative straight leg test. Neurologic:  Normal speech and language. No gross focal neurologic deficits are appreciated. No gait instability. Skin:  Skin is warm,  dry and intact. No rash noted. Psychiatric: Mood and affect are normal. Speech and behavior are normal.  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 ED MD interpretation:    Official radiology report(s): Dg Lumbar Spine Complete  Result Date: 03/16/2018 CLINICAL DATA:  Low back pain radiating to L leg since yesterday. Denies fall or injury. EXAM: LUMBAR SPINE - COMPLETE 4+ VIEW COMPARISON:  None. FINDINGS: Moderate facet hypertrophy, primarily within the LOWER lumbar levels. Disc height loss and uncovertebral spurring at L3-4 and L4-5 and L5-S1. No significant spondylolisthesis. No suspicious lytic or blastic lesions. No acute fractures. IMPRESSION: Moderate degenerative changes.  No evidence for acute  abnormality. Electronically Signed   By: Nolon Nations M.D.   On: 03/16/2018 09:15    ____________________________________________   PROCEDURES  Procedure(s) performed: None  Procedures  Critical Care performed: No  ____________________________________________   INITIAL IMPRESSION / ASSESSMENT AND PLAN / ED COURSE  As part of my medical decision making, I reviewed the following data within the electronic MEDICAL RECORD NUMBER    Radicular back pain secondary to degenerative change of the lumbar spine.  Discussed x-ray findings with patient.  Patient given discharge care instructions advised follow-up with orthopedics for definitive evaluation and treatment.  Patient given a work note for today.      ____________________________________________   FINAL CLINICAL IMPRESSION(S) / ED DIAGNOSES  Final diagnoses:  Radicular low back pain     ED Discharge Orders         Ordered    traMADol (ULTRAM) 50 MG  tablet  Every 6 hours PRN     03/16/18 1015    cyclobenzaprine (FLEXERIL) 10 MG tablet  3 times daily PRN     03/16/18 1015           Note:  This document was prepared using Dragon voice recognition software and may include unintentional dictation errors.    Sable Feil, PA-C 03/16/18 1017    Nance Pear, MD 03/16/18 1153

## 2018-03-16 NOTE — ED Triage Notes (Signed)
Low back pain radiating to L leg since yesterday. Denies fall or injury.  

## 2018-03-16 NOTE — ED Notes (Signed)
See triage note  Presents with lower back pain  States pain is moving into left leg  Pain started yesterday

## 2018-03-26 ENCOUNTER — Ambulatory Visit: Admission: RE | Admit: 2018-03-26 | Payer: Self-pay | Source: Ambulatory Visit | Admitting: Gastroenterology

## 2018-03-26 ENCOUNTER — Encounter: Admission: RE | Payer: Self-pay | Source: Ambulatory Visit

## 2018-03-26 SURGERY — COLONOSCOPY WITH PROPOFOL
Anesthesia: General

## 2018-06-10 ENCOUNTER — Other Ambulatory Visit: Payer: Self-pay

## 2018-06-10 ENCOUNTER — Encounter: Payer: Self-pay | Admitting: Gastroenterology

## 2018-06-10 ENCOUNTER — Ambulatory Visit: Payer: Self-pay | Admitting: Gastroenterology

## 2018-06-10 VITALS — BP 131/83 | HR 67 | Resp 16 | Ht 69.0 in | Wt 182.2 lb

## 2018-06-10 DIAGNOSIS — D126 Benign neoplasm of colon, unspecified: Secondary | ICD-10-CM

## 2018-06-10 DIAGNOSIS — K643 Fourth degree hemorrhoids: Secondary | ICD-10-CM

## 2018-06-10 MED ORDER — HYDROCORTISONE 2.5 % RE CREA: 1.0000 "application " | TOPICAL_CREAM | Freq: Two times a day (BID) | RECTAL | 0 refills | Status: DC

## 2018-06-10 NOTE — Progress Notes (Signed)
Cephas Darby, MD [address]  [place]  [place], [place] [address]  Main: [phone]  Fax: [phone]    Gastroenterology Consultation  Referring Provider:     No ref. provider found Primary Care Physician:  Patient, No Pcp Per Primary Gastroenterologist:  Dr. Cephas Darby Reason for Consultation:     Hemorrhoids        HPI:   Dominique Avila is a 52 y.o.  female referred from emergency room for further evaluation of hemorrhoids. She has history of tobacco dependence, has been suffering from hemorrhoids for a long time. Recent worsening that prompted her to go to emergency room last week. She was found to have grade 3 internal and external hemorrhoids, discharged home on hydrocortisone suppository, lidocaine cream with follow-up in GI clinic. She reports having regular bowel movements, 2-3 per day not associated with gross bleeding. She reports only staining when she wipes. Her hemorrhoids have been terribly painful, unable to sedate, unable to wear tight garments. She has been doing sitz bath with Epsom salt, witch hazel pads, Preparation H but using suppository has been uncomfortable. She reports that her hemorrhoids have gotten worse in last 1 month. She denies having episodes of constipation, straining. She did not have a colonoscopy yet. She does not have any other medical problems. She denies any other GI symptoms. She is taking hydrocodone for pain prescribed by ER.  She denies family history of colon cancer, other GI malignancy. She has been smoking tobacco 1-2 packs per day since age of 52. She denies drinking alcohol.  Follow-up visit 06/10/2018 Patient underwent banding of right anterior, right posterior and left lateral hemorrhoids.  Her symptoms temporarily improved.  She now presents with recurrence of hemorrhoidal symptoms predominantly severe discomfort associated with pinkish discharge after a BM.  GI Procedures:   Colonoscopy 04/02/2017 - Non-thrombosed  external hemorrhoids and non-thrombosed internal hemorrhoids found on perianal exam. - One 5 mm polyp in the transverse colon, removed with a cold snare. Resected and retrieved. - One 25 mm polyp in the proximal ascending colon, removed with mucosal resection. Resected and retrieved. Clips were placed. - Non-bleeding external internal hemorrhoids.  DIAGNOSIS:  A. COLON POLYP, TRANSVERSE; COLD SNARE:  - TUBULAR ADENOMA.  - NEGATIVE FOR HIGH-GRADE DYSPLASIA AND MALIGNANCY.   B. COLON POLYP, ASCENDING; HOT SNARE:  - SESSILE SERRATED ADENOMA.  - NEGATIVE FOR CYTOLOGIC DYSPLASIA AND MALIGNANCY.   No past medical history on file.  Past Surgical History:  Procedure Laterality Date  . COLONOSCOPY WITH PROPOFOL N/A 04/02/2017   Procedure: COLONOSCOPY WITH PROPOFOL;  Surgeon: Lin Landsman, MD;  Location[facility] ENDOSCOPY;  Service: Gastroenterology;  Laterality: N/A;    Current Outpatient Medications:  .  hydrocortisone 1 % ointment, Apply 1 application topically 2 (two) times daily., Disp: 30 g, Rfl: 0 .  lidocaine (XYLOCAINE) 5 % ointment, Apply 1 application topically 2 (two) times daily as needed., Disp: 35.44 g, Rfl: 0 .  cyclobenzaprine (FLEXERIL) 10 MG tablet, Take 1 tablet (10 mg total) by mouth 3 (three) times daily as needed. (Patient not taking: Reported on 06/10/2018), Disp: 15 tablet, Rfl: 0 .  HYDROcodone-acetaminophen (NORCO/VICODIN) 5-325 MG tablet, hydrocodone 5 mg-acetaminophen 325 mg tablet  Take 1 tablet every 4 hours by oral route., Disp: , Rfl:  .  hydrocortisone (ANUSOL-HC) 2.5 % rectal cream, Place 1 application rectally 2 (two) times daily., Disp: 30 g, Rfl: 0 .  predniSONE (STERAPRED UNI-PAK 21 TAB) 10 MG (21) TBPK tablet, See  admin instructions., Disp: , Rfl: 0 .  traMADol (ULTRAM) 50 MG tablet, Take 1 tablet (50 mg total) by mouth every 6 (six) hours as needed. (Patient not taking: Reported on 06/10/2018), Disp: 20 tablet, Rfl: 0    Family History  Problem  Relation Age of Onset  . Diabetes Father      Social History   Tobacco Use  . Smoking status: Current Every Day Smoker    Packs/day: 2.00  . Smokeless tobacco: Never Used  Substance Use Topics  . Alcohol use: No  . Drug use: No    Allergies as of 06/10/2018  . (No Known Allergies)    Review of Systems:    All systems reviewed and negative except where noted in HPI.   Physical Exam:  BP 131/83 (BP Location: Left Arm, Patient Position: Sitting, Cuff Size: Large)   Pulse 67   Resp 16   Ht 5\' 9"  (1.753 m)   Wt 182 lb 3.2 oz (82.6 kg)   BMI 26.91 kg/m  No LMP recorded. Patient is postmenopausal.  General:   Alert,  Well-developed, well-nourished, pleasant and cooperative in NAD Head:  Normocephalic and atraumatic. Eyes:  Sclera clear, no icterus.   Conjunctiva pink. Ears:  Normal auditory acuity. Nose:  No deformity, discharge, or lesions. Mouth:  No deformity or lesions,oropharynx pink & moist. Neck:  Supple; no masses or thyromegaly. Lungs:  Respirations even and unlabored.  Clear throughout to auscultation.   No wheezes, crackles, or rhonchi. No acute distress. Heart:  Regular rate and rhythm; no murmurs, clicks, rubs, or gallops. Abdomen:  Normal bowel sounds.  No bruits.  Soft, non-tender and non-distended without masses, hepatosplenomegaly or hernias noted.  No guarding or rebound tenderness.    Rectum: Grade 4 external hemorrhoids Msk:  Symmetrical without gross deformities. Good, equal movement & strength bilaterally. Pulses:  Normal pulses noted. Extremities:  No clubbing or edema.  No cyanosis. Neurologic:  Alert and oriented x3;  grossly normal neurologically. Skin:  Intact without significant lesions or rashes. No jaundice. Lymph Nodes:  No significant cervical adenopathy. Psych:  Alert and cooperative. Normal mood and affect.  Imaging Studies: None  Assessment and Plan:   Dominique Avila is a 52 y.o. Caucasian female with history of chronic tobacco abuse,  grade 4 external hemorrhoids status post hemorrhoid ligation of RA, RP, LL hemorrhoids.  No evidence of malignancy based on colonoscopy.  She now has recurrence of external hemorrhoids.  Given the degree of hemorrhoids, I recommend referral to surgery for hemorrhoidectomy  Continue hydrocortisone cream and lidocaine cream OTC, apply 2-3 times daily along with witchhazel pads and sitz baths with epsom salt Refer to colorectal/general surgeon to evaluate for hemorrhoidectomy  Tubular adenoma and sessile serrated adenoma: Recommend repeat colonoscopy in 6 months after hemorrhoidectomy  Follow up in 6 months   Cephas Darby, MD

## 2018-07-31 ENCOUNTER — Other Ambulatory Visit: Payer: Self-pay

## 2018-07-31 ENCOUNTER — Encounter: Payer: Self-pay | Admitting: Surgery

## 2018-07-31 ENCOUNTER — Ambulatory Visit (INDEPENDENT_AMBULATORY_CARE_PROVIDER_SITE_OTHER): Payer: Self-pay | Admitting: Surgery

## 2018-07-31 VITALS — BP 120/74 | HR 68 | Temp 98.0°F | Resp 14 | Ht 69.0 in | Wt 184.0 lb

## 2018-07-31 DIAGNOSIS — K648 Other hemorrhoids: Secondary | ICD-10-CM

## 2018-07-31 DIAGNOSIS — K644 Residual hemorrhoidal skin tags: Secondary | ICD-10-CM

## 2018-07-31 NOTE — Patient Instructions (Addendum)
  We will schedule you for hemorrhoid surgery with Dr Hampton Abbot at [facility] on 08/31/18. You will pre admit by phone about a week prior to your surgery. They will call you to do this. Please refer to your surgery instruction sheet.    Surgical Procedures for Hemorrhoids, Care After Refer to this sheet in the next few weeks. These instructions provide you with information about caring for yourself after your procedure. Your health care provider may also give you more specific instructions. Your treatment has been planned according to current medical practices, but problems sometimes occur. Call your health care provider if you have any problems or questions after your procedure. What can I expect after the procedure? After the procedure, it is common to have:  Rectal pain.  Pain when you are having a bowel movement.  Slight rectal bleeding. Follow these instructions at home: Medicines  Take over-the-counter and prescription medicines only as told by your health care provider.  Do not drive or operate heavy machinery while taking prescription pain medicine.  Use a stool softener or a bulk laxative as told by your health care provider. Activity  Rest at home. Return to your normal activities as told by your health care provider.  Do not lift anything that is heavier than 10 lb (4.5 kg).  Do not sit for long periods of time. Take a walk every day or as told by your health care provider.  Do not strain to have a bowel movement. Do not spend a long time sitting on the toilet. Eating and drinking  Eat foods that contain fiber, such as whole grains, beans, nuts, fruits, and vegetables.  Drink enough fluid to keep your urine clear or pale yellow. General instructions  Sit in a warm bath 2-3 times per day to relieve soreness or itching.  Keep all follow-up visits as told by your health care provider. This is important. Contact a health care provider if:  Your pain medicine is not  helping.  You have a fever or chills.  You become constipated.  You have trouble passing urine. Get help right away if:  You have very bad rectal pain.  You have heavy bleeding from your rectum. This information is not intended to replace advice given to you by your health care provider. Make sure you discuss any questions you have with your health care provider. Document Released: 09/28/2003 Document Revised: 12/14/2015 Document Reviewed: 10/03/2014 Elsevier Interactive Patient Education  2019 [name].  Greatly encouraged to slow down on smoking prior to surgery.

## 2018-07-31 NOTE — Progress Notes (Signed)
07/31/2018  Reason for Visit:  Internal and external hemorrhoids  Referring Provider:  Sherri Sear, MD  History of Present Illness: Dominique Avila is a 53 y.o. female referred for evaluation of internal and external hemorrhoids.  The patient has a longstanding history of this for about 2 years.  She has had ligation of the right anterior, right posterior, and left lateral internal hemorrhoids in the past but these are now recurring again.  She had seen Dr. Marius Ditch on June 10, 2018 and she was given hydrocortisone cream and lidocaine cream which she has been using as needed and has been helping quite a lot.  Currently she denies any pain discomfort or bleeding from the hemorrhoids.  She reports currently that the hemorrhoids do not prolapse with bowel movements.  She does report that she uses stool softeners and does not strain at all for her bowel movements.  However she does sit on the toilet for a prolonged amount of time with each bowel movement.  She is interested in surgical management as conservative measures and minimally invasive measures have not been as successful.  Past Medical History: Past Medical History:  Diagnosis Date  . Hemorrhoids 2018     Past Surgical History: Past Surgical History:  Procedure Laterality Date  . COLONOSCOPY WITH PROPOFOL N/A 04/02/2017   Procedure: COLONOSCOPY WITH PROPOFOL;  Surgeon: Lin Landsman, MD;  Location: [facility] ENDOSCOPY;  Service: Gastroenterology;  Laterality: N/A;    Home Medications: Prior to Admission medications   Medication Sig Start Date End Date Taking? Authorizing Provider  hydrocortisone (ANUSOL-HC) 2.5 % rectal cream Place 1 application rectally 2 (two) times daily. 06/10/18  Yes Vanga, Tally Due, MD    Allergies: No Known Allergies  Social History:  reports that she has been smoking. She has been smoking about 2.00 packs per day. She has never used smokeless tobacco. She reports that she does not drink alcohol or  use drugs.   Family History: Family History  Problem Relation Age of Onset  . Diabetes Father   . Breast cancer Maternal Aunt     Review of Systems: Review of Systems  Constitutional: Negative for chills and fever.  HENT: Negative for hearing loss.   Respiratory: Negative for shortness of breath.   Cardiovascular: Negative for chest pain.  Gastrointestinal: Negative for abdominal pain, blood in stool, constipation, nausea and vomiting.  Genitourinary: Negative for dysuria.  Musculoskeletal: Negative for myalgias.  Skin: Negative for rash.  Neurological: Negative for dizziness.  Psychiatric/Behavioral: Negative for depression.    Physical Exam BP 120/74   Pulse 68   Temp 98 F (36.7 C)   Resp 14   Ht 5\' 9"  (1.753 m)   Wt 184 lb (83.5 kg)   BMI 27.17 kg/m  CONSTITUTIONAL: No acute distress HEENT:  Normocephalic, atraumatic, extraocular motion intact. NECK: Trachea is midline, and there is no jugular venous distension.  RESPIRATORY:  Lungs are clear, and breath sounds are equal bilaterally. Normal respiratory effort without pathologic use of accessory muscles. CARDIOVASCULAR: Heart is regular without murmurs, gallops, or rubs. GI: The abdomen is soft, non-tender, non-distended.  RECTAL:   External exam reveals enlarged external hemorrhoids at the right anterior and left lateral components.  There is no inflammation or thrombosis.  There is no tenderness to palpation.  On digital rectal exam, there is also enlarged internal components for the right anterior and left lateral.  There is only some mild enlargement of the right posterior column.  There is no bleeding and  no gross blood on the glove.  MUSCULOSKELETAL:  Normal muscle strength and tone in all four extremities.  No peripheral edema or cyanosis. SKIN: Skin turgor is normal. There are no pathologic skin lesions.  NEUROLOGIC:  Motor and sensation is grossly normal.  Cranial nerves are grossly intact. PSYCH:  Alert and  oriented to person, place and time. Affect is normal.  Laboratory Analysis: No results found for this or any previous visit (from the past 24 hour(s)).  Imaging: No results found.  Assessment and Plan: This is a 53 y.o. female with internal and external hemorrhoids.  Discussed with the patient given her symptoms and previous attempts at banding which have not resolved her issues, it would be reasonable to proceed with surgical management of her hemorrhoids.  Given that she does have 2 of 3 components that are definitely enlarged both internally and externally, would proceed with 2 column hemorrhoidectomy.  Discussed with the patient that doing also because would not be recommended as there is a higher risk of anal canal stenosis.  Given the exam, the right anterior column would definitely be resected and depending how things look in the operating room most likely the left lateral versus the right posterior component.  Discussed with the patient that depending on how the other component looks we may or may not need to proceed with any further surgery in the future after she is healed.  She does understand this plan.  Discussed with the patient the risks of bleeding, infection, and injury to surrounding structures particularly the anal sphincter and she is willing to proceed.  Like to be scheduled for February and she will be scheduled for 08/31/2018.  Face-to-face time spent with the patient and care providers was 60 minutes, with more than 50% of the time spent counseling, educating, and coordinating care of the patient.     Melvyn Neth, [facility]

## 2018-08-17 ENCOUNTER — Other Ambulatory Visit: Payer: Self-pay

## 2018-08-17 ENCOUNTER — Encounter
Admission: RE | Admit: 2018-08-17 | Discharge: 2018-08-17 | Disposition: A | Discharge status: Home / Self Care | Payer: Self-pay | Source: Ambulatory Visit | Attending: Surgery | Admitting: Surgery

## 2018-08-17 HISTORY — DX: Family history of other specified conditions: Z84.89

## 2018-08-17 NOTE — Patient Instructions (Signed)
Your procedure is scheduled on: 08-31-18 Report to Same Day Surgery 2nd floor medical mall [facility] Entrance-take elevator on left to 2nd floor.  Check in with surgery information desk.) To find out your arrival time please call [phone] between 1PM - 3PM on 08-28-18  Remember: Instructions that are not followed completely may result in serious medical risk, up to and including death, or upon the discretion of your surgeon and anesthesiologist your surgery may need to be rescheduled.    _x___ 1. Do not eat food after midnight the night before your procedure. You may drink clear liquids up to 2 hours before you are scheduled to arrive at the hospital for your procedure.  Do not drink clear liquids within 2 hours of your scheduled arrival to the hospital.  Clear liquids include  --Water or Apple juice without pulp  --Clear carbohydrate beverage such as ClearFast or Gatorade  --Black Coffee or Clear Tea (No milk, no creamers, do not add anything to the coffee or Tea   ____Ensure clear carbohydrate drink on the way to the hospital for bariatric patients  ____Ensure clear carbohydrate drink 3 hours before surgery for Dr Dwyane Luo patients if physician instructed.   No gum chewing or hard candies.     __x__ 2. No Alcohol for 24 hours before or after surgery.   __x__3. No Smoking or e-cigarettes for 24 prior to surgery.  Do not use any chewable tobacco products for at least 6 hour prior to surgery   ____  4. Bring all medications with you on the day of surgery if instructed.    __x__ 5. Notify your doctor if there is any change in your medical condition     (cold, fever, infections).    x___6. On the morning of surgery brush your teeth with toothpaste and water.  You may rinse your mouth with mouth wash if you wish.  Do not swallow any toothpaste or mouthwash.   Do not wear jewelry, make-up, hairpins, clips or nail polish.  Do not wear lotions, powders, or perfumes. You may wear  deodorant.  Do not shave 48 hours prior to surgery. Men may shave face and neck.  Do not bring valuables to the hospital.    [facility] is not responsible for any belongings or valuables.               Contacts, dentures or bridgework may not be worn into surgery.  Leave your suitcase in the car. After surgery it may be brought to your room.  For patients admitted to the hospital, discharge time is determined by your treatment team.  _  Patients discharged the day of surgery will not be allowed to drive home.  You will need someone to drive you home and stay with you the night of your procedure.    Please read over the following fact sheets that you were given:   [facility] Preparing for Surgery   ____ Take anti-hypertensive listed below, cardiac, seizure, asthma, anti-reflux and psychiatric medicines. These include:  1. NONE  2.  3.  4.  5.  6.  _X___Fleets enema  as directed-DO FLEET ENEMA AT HOME NIGHT BEFORE SURGERY AND ANOTHER FLEET ENEMA MORNING OF SURGERY 1 HOUR PRIOR TO ARRIVAL TIME TO HOSPITAL  ____ Use CHG Soap or sage wipes as directed on instruction sheet   ____ Use inhalers on the day of surgery and bring to hospital day of surgery  ____ Stop Metformin and Janumet 2 days prior  to surgery.    ____ Take 1/2 of usual insulin dose the night before surgery and none on the morning     surgery.   ____ Follow recommendations from Cardiologist, Pulmonologist or PCP regarding          stopping Aspirin, Coumadin, Plavix ,Eliquis, Effient, or Pradaxa, and Pletal.  X____Stop Anti-inflammatories such as Advil, Aleve, Ibuprofen, Motrin, Naproxen, Naprosyn, Goodies powders or aspirin products 7 DAYS PRIOR TO SURGERY-OK to take Tylenol    ____ Stop supplements until after surgery.     ____ Bring C-Pap to the hospital.

## 2018-08-29 ENCOUNTER — Encounter: Payer: Self-pay | Admitting: Anesthesiology

## 2018-08-30 MED ORDER — CEFAZOLIN SODIUM-DEXTROSE 2-4 GM/100ML-% IV SOLN
2.0000 g | INTRAVENOUS | Status: AC
  Administered 2018-08-31: 2 g via INTRAVENOUS

## 2018-08-31 ENCOUNTER — Ambulatory Visit: Payer: Self-pay | Admitting: Anesthesiology

## 2018-08-31 ENCOUNTER — Ambulatory Visit
Admission: RE | Admit: 2018-08-31 | Discharge: 2018-08-31 | Disposition: A | Discharge status: Home / Self Care | Payer: Self-pay | Attending: Surgery | Admitting: Surgery

## 2018-08-31 ENCOUNTER — Encounter
Admission: RE | Disposition: A | Discharge status: Home / Self Care | Payer: Self-pay | Source: Home / Self Care | Attending: Surgery

## 2018-08-31 ENCOUNTER — Encounter: Payer: Self-pay | Admitting: Certified Registered Nurse Anesthetist

## 2018-08-31 ENCOUNTER — Other Ambulatory Visit: Payer: Self-pay

## 2018-08-31 DIAGNOSIS — K648 Other hemorrhoids: Secondary | ICD-10-CM

## 2018-08-31 DIAGNOSIS — Z79899 Other long term (current) drug therapy: Secondary | ICD-10-CM | POA: Insufficient documentation

## 2018-08-31 DIAGNOSIS — F17210 Nicotine dependence, cigarettes, uncomplicated: Secondary | ICD-10-CM | POA: Insufficient documentation

## 2018-08-31 DIAGNOSIS — K644 Residual hemorrhoidal skin tags: Secondary | ICD-10-CM | POA: Insufficient documentation

## 2018-08-31 DIAGNOSIS — Z833 Family history of diabetes mellitus: Secondary | ICD-10-CM | POA: Insufficient documentation

## 2018-08-31 DIAGNOSIS — Z803 Family history of malignant neoplasm of breast: Secondary | ICD-10-CM | POA: Insufficient documentation

## 2018-08-31 HISTORY — PX: HEMORRHOID SURGERY: SHX153

## 2018-08-31 SURGERY — HEMORRHOIDECTOMY
Anesthesia: General

## 2018-08-31 MED ORDER — CHLORHEXIDINE GLUCONATE CLOTH 2 % EX PADS: 6.0000 | MEDICATED_PAD | Freq: Once | CUTANEOUS | Status: DC

## 2018-08-31 MED ORDER — ACETAMINOPHEN 500 MG PO TABS
1000.0000 mg | ORAL_TABLET | ORAL | Status: AC
  Administered 2018-08-31: 1000 mg via ORAL

## 2018-08-31 MED ORDER — PROMETHAZINE HCL 25 MG/ML IJ SOLN: 6.2500 mg | INTRAMUSCULAR | Status: DC | PRN

## 2018-08-31 MED ORDER — DEXAMETHASONE SODIUM PHOSPHATE 10 MG/ML IJ SOLN
INTRAMUSCULAR | Status: AC
  Filled 2018-08-31: qty 1

## 2018-08-31 MED ORDER — FLEET ENEMA 7-19 GM/118ML RE ENEM
1.0000 | ENEMA | Freq: Once | RECTAL | Status: AC
  Administered 2018-08-31: 1 via RECTAL

## 2018-08-31 MED ORDER — OXYCODONE HCL 5 MG/5ML PO SOLN: 5.0000 mg | Freq: Once | ORAL | Status: DC | PRN

## 2018-08-31 MED ORDER — BUPIVACAINE LIPOSOME 1.3 % IJ SUSP
INTRAMUSCULAR | Status: DC | PRN
  Administered 2018-08-31: 20 mL

## 2018-08-31 MED ORDER — LIDOCAINE HCL URETHRAL/MUCOSAL 2 % EX GEL
CUTANEOUS | Status: AC
  Filled 2018-08-31: qty 10

## 2018-08-31 MED ORDER — DEXAMETHASONE SODIUM PHOSPHATE 10 MG/ML IJ SOLN
INTRAMUSCULAR | Status: DC | PRN
  Administered 2018-08-31: 10 mg via INTRAVENOUS

## 2018-08-31 MED ORDER — FLEET ENEMA 7-19 GM/118ML RE ENEM: 1.0000 | ENEMA | Freq: Once | RECTAL | Status: DC

## 2018-08-31 MED ORDER — FAMOTIDINE 20 MG PO TABS
ORAL_TABLET | ORAL | Status: AC
  Administered 2018-08-31: 20 mg via ORAL
  Filled 2018-08-31: qty 1

## 2018-08-31 MED ORDER — ONDANSETRON HCL 4 MG/2ML IJ SOLN
INTRAMUSCULAR | Status: AC
  Filled 2018-08-31: qty 2

## 2018-08-31 MED ORDER — LIDOCAINE HCL (PF) 2 % IJ SOLN
INTRAMUSCULAR | Status: AC
  Filled 2018-08-31: qty 10

## 2018-08-31 MED ORDER — ONDANSETRON HCL 4 MG/2ML IJ SOLN
INTRAMUSCULAR | Status: DC | PRN
  Administered 2018-08-31: 4 mg via INTRAVENOUS

## 2018-08-31 MED ORDER — SURGIFOAM 100 EX MISC
CUTANEOUS | Status: DC | PRN
  Administered 2018-08-31: 1 via TOPICAL

## 2018-08-31 MED ORDER — BUPIVACAINE-EPINEPHRINE 0.5% -1:200000 IJ SOLN
INTRAMUSCULAR | Status: DC | PRN
  Administered 2018-08-31: 30 mL

## 2018-08-31 MED ORDER — GELATIN ADSORBABLE OP FILM
ORAL_FILM | OPHTHALMIC | Status: AC
  Filled 2018-08-31: qty 1

## 2018-08-31 MED ORDER — GABAPENTIN 300 MG PO CAPS
ORAL_CAPSULE | ORAL | Status: AC
  Administered 2018-08-31: 300 mg via ORAL
  Filled 2018-08-31: qty 1

## 2018-08-31 MED ORDER — FENTANYL CITRATE (PF) 100 MCG/2ML IJ SOLN
INTRAMUSCULAR | Status: AC
  Filled 2018-08-31: qty 2

## 2018-08-31 MED ORDER — MIDAZOLAM HCL 2 MG/2ML IJ SOLN
INTRAMUSCULAR | Status: DC | PRN
  Administered 2018-08-31: 2 mg via INTRAVENOUS

## 2018-08-31 MED ORDER — FENTANYL CITRATE (PF) 100 MCG/2ML IJ SOLN
INTRAMUSCULAR | Status: DC | PRN
  Administered 2018-08-31 (×2): 50 ug via INTRAVENOUS
  Administered 2018-08-31 (×2): 25 ug via INTRAVENOUS

## 2018-08-31 MED ORDER — IPRATROPIUM-ALBUTEROL 0.5-2.5 (3) MG/3ML IN SOLN
RESPIRATORY_TRACT | Status: AC
  Administered 2018-08-31: 3 mL via RESPIRATORY_TRACT
  Filled 2018-08-31: qty 3

## 2018-08-31 MED ORDER — PROPOFOL 10 MG/ML IV BOLUS
INTRAVENOUS | Status: AC
  Filled 2018-08-31: qty 20

## 2018-08-31 MED ORDER — OXYCODONE HCL 5 MG PO TABS: 5.0000 mg | ORAL_TABLET | ORAL | 0 refills | Status: DC | PRN

## 2018-08-31 MED ORDER — PROPOFOL 10 MG/ML IV BOLUS
INTRAVENOUS | Status: DC | PRN
  Administered 2018-08-31: 180 mg via INTRAVENOUS

## 2018-08-31 MED ORDER — GELATIN ABSORBABLE 100 CM EX MISC
CUTANEOUS | Status: AC
  Filled 2018-08-31: qty 1

## 2018-08-31 MED ORDER — MIDAZOLAM HCL 2 MG/2ML IJ SOLN
INTRAMUSCULAR | Status: AC
  Filled 2018-08-31: qty 2

## 2018-08-31 MED ORDER — IPRATROPIUM-ALBUTEROL 0.5-2.5 (3) MG/3ML IN SOLN
3.0000 mL | Freq: Once | RESPIRATORY_TRACT | Status: AC
  Administered 2018-08-31: 3 mL via RESPIRATORY_TRACT

## 2018-08-31 MED ORDER — IBUPROFEN 600 MG PO TABS: 600.0000 mg | ORAL_TABLET | Freq: Three times a day (TID) | ORAL | 0 refills | Status: DC | PRN

## 2018-08-31 MED ORDER — MEPERIDINE HCL 50 MG/ML IJ SOLN: 6.2500 mg | INTRAMUSCULAR | Status: DC | PRN

## 2018-08-31 MED ORDER — GABAPENTIN 300 MG PO CAPS
300.0000 mg | ORAL_CAPSULE | ORAL | Status: AC
  Administered 2018-08-31: 300 mg via ORAL

## 2018-08-31 MED ORDER — LACTATED RINGERS IV SOLN
INTRAVENOUS | Status: DC
  Administered 2018-08-31: 09:00:00 via INTRAVENOUS

## 2018-08-31 MED ORDER — ACETAMINOPHEN 500 MG PO TABS
ORAL_TABLET | ORAL | Status: AC
  Administered 2018-08-31: 1000 mg via ORAL
  Filled 2018-08-31: qty 2

## 2018-08-31 MED ORDER — CEFAZOLIN SODIUM-DEXTROSE 2-4 GM/100ML-% IV SOLN
INTRAVENOUS | Status: AC
  Filled 2018-08-31: qty 100

## 2018-08-31 MED ORDER — FENTANYL CITRATE (PF) 100 MCG/2ML IJ SOLN: 25.0000 ug | INTRAMUSCULAR | Status: DC | PRN

## 2018-08-31 MED ORDER — OXYCODONE HCL 5 MG PO TABS: 5.0000 mg | ORAL_TABLET | Freq: Once | ORAL | Status: DC | PRN

## 2018-08-31 MED ORDER — FAMOTIDINE 20 MG PO TABS
20.0000 mg | ORAL_TABLET | Freq: Once | ORAL | Status: AC
  Administered 2018-08-31: 20 mg via ORAL

## 2018-08-31 MED ORDER — LIDOCAINE HCL URETHRAL/MUCOSAL 2 % EX GEL
CUTANEOUS | Status: DC | PRN
  Administered 2018-08-31: 1

## 2018-08-31 SURGICAL SUPPLY — 31 items
BRIEF STRETCH MATERNITY 2XLG (MISCELLANEOUS) ×3 IMPLANT
CANISTER SUCT 1200ML W/VALVE (MISCELLANEOUS) ×3 IMPLANT
COVER WAND RF STERILE (DRAPES) ×3 IMPLANT
DRAPE LAPAROTOMY 100X77 ABD (DRAPES) ×3 IMPLANT
DRAPE LEGGINS SURG 28X43 STRL (DRAPES) ×3 IMPLANT
DRAPE PERI LITHO V/GYN (MISCELLANEOUS) ×3 IMPLANT
DRAPE UNDER BUTTOCK W/FLU (DRAPES) ×3 IMPLANT
DRSG GAUZE FLUFF 36X18 (GAUZE/BANDAGES/DRESSINGS) ×3 IMPLANT
ELECT REM PT RETURN 9FT ADLT (ELECTROSURGICAL) ×3
ELECTRODE REM PT RTRN 9FT ADLT (ELECTROSURGICAL) ×1 IMPLANT
GLOVE SURG SYN 7.0 (GLOVE) ×3 IMPLANT
GLOVE SURG SYN 7.5  E (GLOVE) ×2
GLOVE SURG SYN 7.5 E (GLOVE) ×1 IMPLANT
GOWN STRL REUS W/ TWL LRG LVL3 (GOWN DISPOSABLE) ×2 IMPLANT
GOWN STRL REUS W/TWL LRG LVL3 (GOWN DISPOSABLE) ×4
KIT TURNOVER KIT A (KITS) ×3 IMPLANT
LABEL OR SOLS (LABEL) ×3 IMPLANT
LIGASURE IMPACT 36 18CM CVD LR (INSTRUMENTS) ×3 IMPLANT
LIGASURE MARYLAND LAP STAND (ELECTROSURGICAL) ×3 IMPLANT
NEEDLE HYPO 22GX1.5 SAFETY (NEEDLE) ×3 IMPLANT
NS IRRIG 500ML POUR BTL (IV SOLUTION) ×3 IMPLANT
PACK BASIN MINOR ARMC (MISCELLANEOUS) ×3 IMPLANT
PAD OB MATERNITY 4.3X12.25 (PERSONAL CARE ITEMS) ×3 IMPLANT
PAD PREP 24X41 OB/GYN DISP (PERSONAL CARE ITEMS) ×3 IMPLANT
SOL PREP PVP 2OZ (MISCELLANEOUS) ×3
SOLUTION PREP PVP 2OZ (MISCELLANEOUS) ×1 IMPLANT
SURGILUBE 2OZ TUBE FLIPTOP (MISCELLANEOUS) ×3 IMPLANT
SUT VIC AB 2-0 SH 27 (SUTURE) ×4
SUT VIC AB 2-0 SH 27XBRD (SUTURE) ×2 IMPLANT
SYR 10ML LL (SYRINGE) ×3 IMPLANT
SYR BULB IRRIG 60ML STRL (SYRINGE) ×3 IMPLANT

## 2018-08-31 NOTE — Anesthesia Post-op Follow-up Note (Signed)
Anesthesia QCDR form completed.        

## 2018-08-31 NOTE — Op Note (Signed)
  Procedure Date:  08/31/2018  Pre-operative Diagnosis:  Internal and External Hemorrhoids  Post-operative Diagnosis:  Internal and External Hemorrhoids  Procedure:  Hemorrhoidectomy  Surgeon:  Melvyn Neth, MD  Anesthesia:  General endotracheal  Estimated Blood Loss:  25 ml  Specimens:  Right anterior, Right posterior, and left lateral hemorrhoidal columns  Complications:  None  Indications for Procedure:  This is a 53 y.o. female with history of internal and external hemorrhoids, with prior history of banding of all three internal columns in the past.  The options of surgery versus observation were reviewed with the patient and/or family. The risks of bleeding, infection, recurrence of symptoms, abscess or infection, were all discussed with the patient and she was willing to proceed.  Description of Procedure: The patient was correctly identified in the preoperative area and brought into the operating room.  The patient was placed supine with VTE prophylaxis in place.  Appropriate time-outs were performed.  Anesthesia was induced and the patient was intubated.  The patient was then placed in lithotomy position.   The perianal area was prepped and draped in usual sterile fashion.  Manual dilation of the sphincter muscle was done with 1, 2, and 3 fingers.  A bivalve speculum was placed into the anal canal and all columns were evaluated.  The right anterior and right posterior were the largest of the three.  We started with the right posterior, using bovie cautery to start at the junction of anal mucosa and skin, then using the LigaSure to resect the posterior column. 2-0 Vicryl was then used to close over the resection edges, leaving a small opening at the distal end at the skin level for drainage.  The same was done with the right anterior column.  After that, the anal canal was evaluated again, and there was enough excess tissue from the lateral column that it was decided to resect it as  well, in the same fashion with bovie, LigaSure, and closure with 2-0 Vicryl.  The anal canal was then irrigated and there was appropriate hemostasis.  50 ml of Exparel solution with 0.25% Bupivacaine with epi was injected in the perianal area and deep for a block.  A sheet of gelfoam was then rolled with lidocaine jel and placed into the anal canal for hemostasis and pain control.  The area was cleaned and dressed with fluff gauze and mesh underwear.  The patient was then placed back on flat supine position, emerged from anesthesia, extubated, and brought to the recovery room for further management.  The patient tolerated the procedure well and all counts were correct at the end of the case.   Melvyn Neth, MD

## 2018-08-31 NOTE — Interval H&P Note (Signed)
History and Physical Interval Note:  08/31/2018 10:15 AM  Dominique Avila  has presented today for surgery, with the diagnosis of Internal and external hemorrhoids  The various methods of treatment have been discussed with the patient and family. After consideration of risks, benefits and other options for treatment, the patient has consented to  Procedure(s): HEMORRHOIDECTOMY (N/A) as a surgical intervention .  The patient's history has been reviewed, patient examined, no change in status, stable for surgery.  I have reviewed the patient's chart and labs.  Questions were answered to the patient's satisfaction.     Keisuke Hollabaugh

## 2018-08-31 NOTE — Progress Notes (Signed)
08/31/2018  Reason for Visit:  Internal and external hemorrhoids  Referring Provider:  Sherri Sear, MD  History of Present Illness: Dominique Avila is a 53 y.o. female referred for evaluation of internal and external hemorrhoids.  The patient has a longstanding history of this for about 2 years.  She has had ligation of the right anterior, right posterior, and left lateral internal hemorrhoids in the past but these are now recurring again.  She had seen Dr. Marius Ditch on June 10, 2018 and she was given hydrocortisone cream and lidocaine cream which she has been using as needed and has been helping quite a lot.  Currently she denies any pain discomfort or bleeding from the hemorrhoids.  She reports currently that the hemorrhoids do not prolapse with bowel movements.  She does report that she uses stool softeners and does not strain at all for her bowel movements.  However she does sit on the toilet for a prolonged amount of time with each bowel movement.  She is interested in surgical management as conservative measures and minimally invasive measures have not been as successful.  Past Medical History: Past Medical History:  Diagnosis Date  . Family history of adverse reaction to anesthesia    MOM-HARD TIME WAKING UP  . Hemorrhoids 2018     Past Surgical History: Past Surgical History:  Procedure Laterality Date  . COLONOSCOPY WITH PROPOFOL N/A 04/02/2017   Procedure: COLONOSCOPY WITH PROPOFOL;  Surgeon: Lin Landsman, MD;  Location: [facility] ENDOSCOPY;  Service: Gastroenterology;  Laterality: N/A;    Home Medications: Prior to Admission medications   Medication Sig Start Date End Date Taking? Authorizing Provider  hydrocortisone (ANUSOL-HC) 2.5 % rectal cream Place 1 application rectally 2 (two) times daily. 06/10/18  Yes Vanga, Tally Due, MD    Allergies: No Known Allergies  Social History:  reports that she has been smoking cigarettes. She has a 80.00 pack-year smoking history.  She has never used smokeless tobacco. She reports that she does not drink alcohol or use drugs.   Family History: Family History  Problem Relation Age of Onset  . Diabetes Father   . Breast cancer Maternal Aunt     Review of Systems: Review of Systems  Constitutional: Negative for chills and fever.  HENT: Negative for hearing loss.   Respiratory: Negative for shortness of breath.   Cardiovascular: Negative for chest pain.  Gastrointestinal: Negative for abdominal pain, blood in stool, constipation, nausea and vomiting.  Genitourinary: Negative for dysuria.  Musculoskeletal: Negative for myalgias.  Skin: Negative for rash.  Neurological: Negative for dizziness.  Psychiatric/Behavioral: Negative for depression.    Physical Exam BP 118/76   Pulse 64   Temp (!) 97.5 F (36.4 C) (Tympanic)   Resp 18   Ht 5\' 9"  (1.753 m)   Wt 83.5 kg   SpO2 97%   BMI 27.17 kg/m  CONSTITUTIONAL: No acute distress HEENT:  Normocephalic, atraumatic, extraocular motion intact. NECK: Trachea is midline, and there is no jugular venous distension.  RESPIRATORY:  Lungs are clear, and breath sounds are equal bilaterally. Normal respiratory effort without pathologic use of accessory muscles. CARDIOVASCULAR: Heart is regular without murmurs, gallops, or rubs. GI: The abdomen is soft, non-tender, non-distended.  RECTAL:   External exam reveals enlarged external hemorrhoids at the right anterior and left lateral components.  There is no inflammation or thrombosis.  There is no tenderness to palpation.  On digital rectal exam, there is also enlarged internal components for the right anterior and left  lateral.  There is only some mild enlargement of the right posterior column.  There is no bleeding and no gross blood on the glove.  MUSCULOSKELETAL:  Normal muscle strength and tone in all four extremities.  No peripheral edema or cyanosis. SKIN: Skin turgor is normal. There are no pathologic skin lesions.   NEUROLOGIC:  Motor and sensation is grossly normal.  Cranial nerves are grossly intact. PSYCH:  Alert and oriented to person, place and time. Affect is normal.  Laboratory Analysis: No results found for this or any previous visit (from the past 24 hour(s)).  Imaging: No results found.  Assessment and Plan: This is a 53 y.o. female with internal and external hemorrhoids.  Discussed with the patient given her symptoms and previous attempts at banding which have not resolved her issues, it would be reasonable to proceed with surgical management of her hemorrhoids.  Given that she does have 2 of 3 components that are definitely enlarged both internally and externally, would proceed with 2 column hemorrhoidectomy.  Discussed with the patient that doing also because would not be recommended as there is a higher risk of anal canal stenosis.  Given the exam, the right anterior column would definitely be resected and depending how things look in the operating room most likely the left lateral versus the right posterior component.  Discussed with the patient that depending on how the other component looks we may or may not need to proceed with any further surgery in the future after she is healed.  She does understand this plan.  Discussed with the patient the risks of bleeding, infection, and injury to surrounding structures particularly the anal sphincter and she is willing to proceed.  Like to be scheduled for February and she will be scheduled for 08/31/2018.  Face-to-face time spent with the patient and care providers was 60 minutes, with more than 50% of the time spent counseling, educating, and coordinating care of the patient.     Melvyn Neth, [facility]

## 2018-08-31 NOTE — H&P (View-Only) (Signed)
08/31/2018  Reason for Visit:  Internal and external hemorrhoids  Referring Provider:  Sherri Sear, MD  History of Present Illness: Dominique Avila is a 53 y.o. female referred for evaluation of internal and external hemorrhoids.  The patient has a longstanding history of this for about 2 years.  She has had ligation of the right anterior, right posterior, and left lateral internal hemorrhoids in the past but these are now recurring again.  She had seen Dr. Marius Ditch on June 10, 2018 and she was given hydrocortisone cream and lidocaine cream which she has been using as needed and has been helping quite a lot.  Currently she denies any pain discomfort or bleeding from the hemorrhoids.  She reports currently that the hemorrhoids do not prolapse with bowel movements.  She does report that she uses stool softeners and does not strain at all for her bowel movements.  However she does sit on the toilet for a prolonged amount of time with each bowel movement.  She is interested in surgical management as conservative measures and minimally invasive measures have not been as successful.  Past Medical History: Past Medical History:  Diagnosis Date  . Family history of adverse reaction to anesthesia    MOM-HARD TIME WAKING UP  . Hemorrhoids 2018     Past Surgical History: Past Surgical History:  Procedure Laterality Date  . COLONOSCOPY WITH PROPOFOL N/A 04/02/2017   Procedure: COLONOSCOPY WITH PROPOFOL;  Surgeon: Lin Landsman, MD;  Location: [facility] ENDOSCOPY;  Service: Gastroenterology;  Laterality: N/A;    Home Medications: Prior to Admission medications   Medication Sig Start Date End Date Taking? Authorizing Provider  hydrocortisone (ANUSOL-HC) 2.5 % rectal cream Place 1 application rectally 2 (two) times daily. 06/10/18  Yes Vanga, Tally Due, MD    Allergies: No Known Allergies  Social History:  reports that she has been smoking cigarettes. She has a 80.00 pack-year smoking history.  She has never used smokeless tobacco. She reports that she does not drink alcohol or use drugs.   Family History: Family History  Problem Relation Age of Onset  . Diabetes Father   . Breast cancer Maternal Aunt     Review of Systems: Review of Systems  Constitutional: Negative for chills and fever.  HENT: Negative for hearing loss.   Respiratory: Negative for shortness of breath.   Cardiovascular: Negative for chest pain.  Gastrointestinal: Negative for abdominal pain, blood in stool, constipation, nausea and vomiting.  Genitourinary: Negative for dysuria.  Musculoskeletal: Negative for myalgias.  Skin: Negative for rash.  Neurological: Negative for dizziness.  Psychiatric/Behavioral: Negative for depression.    Physical Exam BP 118/76   Pulse 64   Temp (!) 97.5 F (36.4 C) (Tympanic)   Resp 18   Ht 5\' 9"  (1.753 m)   Wt 83.5 kg   SpO2 97%   BMI 27.17 kg/m  CONSTITUTIONAL: No acute distress HEENT:  Normocephalic, atraumatic, extraocular motion intact. NECK: Trachea is midline, and there is no jugular venous distension.  RESPIRATORY:  Lungs are clear, and breath sounds are equal bilaterally. Normal respiratory effort without pathologic use of accessory muscles. CARDIOVASCULAR: Heart is regular without murmurs, gallops, or rubs. GI: The abdomen is soft, non-tender, non-distended.  RECTAL:   External exam reveals enlarged external hemorrhoids at the right anterior and left lateral components.  There is no inflammation or thrombosis.  There is no tenderness to palpation.  On digital rectal exam, there is also enlarged internal components for the right anterior and left  lateral.  There is only some mild enlargement of the right posterior column.  There is no bleeding and no gross blood on the glove.  MUSCULOSKELETAL:  Normal muscle strength and tone in all four extremities.  No peripheral edema or cyanosis. SKIN: Skin turgor is normal. There are no pathologic skin lesions.   NEUROLOGIC:  Motor and sensation is grossly normal.  Cranial nerves are grossly intact. PSYCH:  Alert and oriented to person, place and time. Affect is normal.  Laboratory Analysis: No results found for this or any previous visit (from the past 24 hour(s)).  Imaging: No results found.  Assessment and Plan: This is a 52 y.o. female with internal and external hemorrhoids.  Discussed with the patient given her symptoms and previous attempts at banding which have not resolved her issues, it would be reasonable to proceed with surgical management of her hemorrhoids.  Given that she does have 2 of 3 components that are definitely enlarged both internally and externally, would proceed with 2 column hemorrhoidectomy.  Discussed with the patient that doing also because would not be recommended as there is a higher risk of anal canal stenosis.  Given the exam, the right anterior column would definitely be resected and depending how things look in the operating room most likely the left lateral versus the right posterior component.  Discussed with the patient that depending on how the other component looks we may or may not need to proceed with any further surgery in the future after she is healed.  She does understand this plan.  Discussed with the patient the risks of bleeding, infection, and injury to surrounding structures particularly the anal sphincter and she is willing to proceed.  Like to be scheduled for February and she will be scheduled for 08/31/2018.  Face-to-face time spent with the patient and care providers was 60 minutes, with more than 50% of the time spent counseling, educating, and coordinating care of the patient.     Melvyn Neth, [facility]

## 2018-08-31 NOTE — Discharge Instructions (Signed)
How to Take a [employer] A sitz bath is a warm water bath that may be used to care for your rectum, genital area, or the area between your rectum and genitals (perineum). For a sitz bath, the water only comes up to your hips and covers your buttocks. A sitz bath may done at home in a bathtub or with a portable sitz bath that fits over the toilet. Your health care provider may recommend a sitz bath to help:  Relieve pain and discomfort after delivering a baby.  Relieve pain and itching from hemorrhoids or anal fissures.  Relieve pain after certain surgeries.  Relax muscles that are sore or tight. How to take a sitz bath Take 3-4 sitz baths a day, or as many as told by your health care provider. Bathtub sitz bath To take a sitz bath in a bathtub: 1. Partially fill a bathtub with warm water. The water should be deep enough to cover your hips and buttocks when you are sitting in the tub. 2. If your health care provider told you to put medicine in the water, follow his or her instructions. 3. Sit in the water. 4. Open the tub drain a little, and leave it open during your bath. 5. Turn on the warm water again, enough to replace the water that is draining out. Keep the water running throughout your bath. This helps keep the water at the right level and the right temperature. 6. Soak in the water for 15-20 minutes, or as long as told by your health care provider. 7. When you are done, be careful when you stand up. You may feel dizzy. 8. After the sitz bath, pat yourself dry. Do not rub your skin to dry it.  Over-the-toilet sitz bath To take a sitz bath with an over-the-toilet basin: 1. Follow the manufacturer's instructions. 2. Fill the basin with warm water. 3. If your health care provider told you to put medicine in the water, follow his or her instructions. 4. Sit on the seat. Make sure the water covers your buttocks and perineum. 5. Soak in the water for 15-20 minutes, or as long as told by  your health care provider. 6. After the sitz bath, pat yourself dry. Do not rub your skin to dry it. 7. Clean and dry the basin between uses. 8. Discard the basin if it cracks, or according to the manufacturer's instructions. Contact a health care provider if:  Your symptoms get worse. Do not continue with sitz baths if your symptoms get worse.  You have new symptoms. If this happens, do not continue with sitz baths until you talk with your health care provider. Summary  A sitz bath is a warm water bath in which the water only comes up to your hips and covers your buttocks.  A sitz bath may help relieve itching, relieve pain, and relax muscles that are sore or tight in the lower part of your body, including your genital area.  Take 3-4 sitz baths a day, or as many as told by your health care provider. Soak in the water for 15-20 minutes.  Do not continue with sitz baths if your symptoms get worse. This information is not intended to replace advice given to you by your health care provider. Make sure you discuss any questions you have with your health care provider. Document Released: 03/30/2004 Document Revised: 07/10/2017 Document Reviewed: 07/10/2017 Elsevier Interactive Patient Education  2019 [place]. Surgical Procedures for Hemorrhoids, Care After Refer to this  sheet in the next few weeks. These instructions provide you with information about caring for yourself after your procedure. Your health care provider may also give you more specific instructions. Your treatment has been planned according to current medical practices, but problems sometimes occur. Call your health care provider if you have any problems or questions after your procedure. What can I expect after the procedure? After the procedure, it is common to have:  Rectal pain.  Pain when you are having a bowel movement.  Slight rectal bleeding. Follow these instructions at home: Medicines  Take over-the-counter  and prescription medicines only as told by your health care provider.  Do not drive or operate heavy machinery while taking prescription pain medicine.  Use a stool softener or a bulk laxative as told by your health care provider. Activity  Rest at home. Return to your normal activities as told by your health care provider.  Do not lift anything that is heavier than 10 lb (4.5 kg).  Do not sit for long periods of time. Take a walk every day or as told by your health care provider.  Do not strain to have a bowel movement. Do not spend a long time sitting on the toilet. Eating and drinking  Eat foods that contain fiber, such as whole grains, beans, nuts, fruits, and vegetables.  Drink enough fluid to keep your urine clear or pale yellow. General instructions  Sit in a warm bath 2-3 times per day to relieve soreness or itching.  Keep all follow-up visits as told by your health care provider. This is important. Contact a health care provider if:  Your pain medicine is not helping.  You have a fever or chills.  You become constipated.  You have trouble passing urine. Get help right away if:  You have very bad rectal pain.  You have heavy bleeding from your rectum. This information is not intended to replace advice given to you by your health care provider. Make sure you discuss any questions you have with your health care provider. Document Released: 09/28/2003 Document Revised: 12/14/2015 Document Reviewed: 10/03/2014 Elsevier Interactive Patient Education  2019 [place]   1) The drugs that you were given will stay in your system until tomorrow so for the next 24 hours you should not:  A) Drive an automobile B) Make any legal decisions C) Drink any alcoholic beverage   2) You may resume regular meals tomorrow.  Today it is better to start with liquids and gradually work up to solid foods.  You may eat anything you  prefer, but it is better to start with liquids, then soup and crackers, and gradually work up to solid foods.   3) Please notify your doctor immediately if you have any unusual bleeding, trouble breathing, redness and pain at the surgery site, drainage, fever, or pain not relieved by medication.    4) Additional Instructions:        Please contact your physician with any problems or Same Day Surgery at [phone], Monday through Friday 6 am to 4 pm, or [place] at [facility] number at [phone].

## 2018-08-31 NOTE — Transfer of Care (Signed)
Immediate Anesthesia Transfer of Care Note  Patient: Dominique Avila  Procedure(s) Performed: HEMORRHOIDECTOMY (N/A )  Patient Location: PACU  Anesthesia Type:General  Level of Consciousness: drowsy and patient cooperative  Airway & Oxygen Therapy: Patient Spontanous Breathing and Patient connected to face mask oxygen  Post-op Assessment: Report given to RN and Post -op Vital signs reviewed and stable  Post vital signs: Reviewed and stable  Last Vitals:  Vitals Value Taken Time  BP 126/72 08/31/2018 12:33 PM  Temp    Pulse 64 08/31/2018 12:33 PM  Resp 19 08/31/2018 12:33 PM  SpO2 98 % 08/31/2018 12:33 PM  Vitals shown include unvalidated device data.  Last Pain:  Vitals:   08/31/18 0803  TempSrc: Tympanic  PainSc: 0-No pain         Complications: No apparent anesthesia complications

## 2018-08-31 NOTE — Anesthesia Procedure Notes (Signed)
Procedure Name: LMA Insertion Date/Time: 08/31/2018 10:47 AM Performed by: Jonna Clark, CRNA Pre-anesthesia Checklist: Patient identified, Patient being monitored, Timeout performed, Emergency Drugs available and Suction available Patient Re-evaluated:Patient Re-evaluated prior to induction Oxygen Delivery Method: Circle system utilized Preoxygenation: Pre-oxygenation with 100% oxygen Induction Type: IV induction Ventilation: Mask ventilation without difficulty LMA: LMA inserted LMA Size: 4.0 Tube type: Oral Number of attempts: 1 Placement Confirmation: positive ETCO2 and breath sounds checked- equal and bilateral Tube secured with: Tape Dental Injury: Teeth and Oropharynx as per pre-operative assessment

## 2018-08-31 NOTE — Anesthesia Postprocedure Evaluation (Signed)
Anesthesia Post Note  Patient: Dominique Avila  Procedure(s) Performed: HEMORRHOIDECTOMY (N/A )  Patient location during evaluation: PACU Anesthesia Type: General Level of consciousness: awake and alert Pain management: pain level controlled Vital Signs Assessment: post-procedure vital signs reviewed and stable Respiratory status: spontaneous breathing, nonlabored ventilation, respiratory function stable and patient connected to nasal cannula oxygen Cardiovascular status: blood pressure returned to baseline and stable Postop Assessment: no apparent nausea or vomiting Anesthetic complications: no     Last Vitals:  Vitals:   08/31/18 1334 08/31/18 1400  BP: (!) 152/91 134/88  Pulse: 63 66  Resp: 18 18  Temp: (!) 36.3 C   SpO2: 99% 100%    Last Pain:  Vitals:   08/31/18 1400  TempSrc:   PainSc: 0-No pain                 Dominique Avila S

## 2018-08-31 NOTE — Anesthesia Preprocedure Evaluation (Addendum)
Anesthesia Evaluation  Patient identified by MRN, date of birth, ID band Patient awake    Reviewed: Allergy & Precautions, NPO status , Patient's Chart, lab work & pertinent test results, reviewed documented beta blocker date and time   History of Anesthesia Complications Negative for: history of anesthetic complications  Airway Mallampati: II  TM Distance: >3 FB Neck ROM: Full    Dental  (+) Edentulous Lower, Upper Dentures   Pulmonary neg sleep apnea, neg COPD, Current Smoker,    breath sounds clear to auscultation- rhonchi (-) wheezing      Cardiovascular Exercise Tolerance: Good (-) hypertension(-) CAD, (-) Past MI, (-) Cardiac Stents and (-) CABG  Rhythm:Regular Rate:Normal - Systolic murmurs and - Diastolic murmurs    Neuro/Psych neg Seizures negative neurological ROS  negative psych ROS   GI/Hepatic negative GI ROS, Neg liver ROS,   Endo/Other  negative endocrine ROSneg diabetes  Renal/GU negative Renal ROS     Musculoskeletal negative musculoskeletal ROS (+)   Abdominal (+) - obese,   Peds  Hematology negative hematology ROS (+)   Anesthesia Other Findings Past Medical History: No date: Family history of adverse reaction to anesthesia     Comment:  MOM-HARD TIME WAKING UP 2018: Hemorrhoids   Reproductive/Obstetrics                            Anesthesia Physical Anesthesia Plan  ASA: II  Anesthesia Plan: General   Post-op Pain Management:    Induction: Intravenous  PONV Risk Score and Plan: 1 and Ondansetron and Midazolam  Airway Management Planned: Oral ETT  Additional Equipment:   Intra-op Plan:   Post-operative Plan: Extubation in OR  Informed Consent: I have reviewed the patients History and Physical, chart, labs and discussed the procedure including the risks, benefits and alternatives for the proposed anesthesia with the patient or authorized representative who  has indicated his/her understanding and acceptance.     Dental advisory given  Plan Discussed with: CRNA  Anesthesia Plan Comments:        Anesthesia Quick Evaluation

## 2018-08-31 NOTE — Anesthesia Procedure Notes (Signed)
Performed by: Wladyslaw Henrichs, CRNA       

## 2018-09-02 LAB — SURGICAL PATHOLOGY

## 2018-09-04 ENCOUNTER — Other Ambulatory Visit: Payer: Self-pay

## 2018-09-04 ENCOUNTER — Ambulatory Visit (INDEPENDENT_AMBULATORY_CARE_PROVIDER_SITE_OTHER): Payer: Self-pay | Admitting: Surgery

## 2018-09-04 ENCOUNTER — Encounter: Payer: Self-pay | Admitting: Surgery

## 2018-09-04 VITALS — BP 162/92 | HR 64 | Temp 97.7°F | Resp 18 | Ht 69.0 in | Wt 188.4 lb

## 2018-09-04 DIAGNOSIS — Z09 Encounter for follow-up examination after completed treatment for conditions other than malignant neoplasm: Secondary | ICD-10-CM

## 2018-09-04 DIAGNOSIS — K648 Other hemorrhoids: Secondary | ICD-10-CM

## 2018-09-04 DIAGNOSIS — K644 Residual hemorrhoidal skin tags: Secondary | ICD-10-CM

## 2018-09-04 MED ORDER — LIDOCAINE (ANORECTAL) 5 % EX CREA: 1.0000 | TOPICAL_CREAM | Freq: Two times a day (BID) | CUTANEOUS | 0 refills | Status: DC

## 2018-09-04 MED ORDER — LIDOCAINE (ANORECTAL) 5 % EX CREA: 1.0000 | TOPICAL_CREAM | Freq: Three times a day (TID) | CUTANEOUS | 0 refills | Status: DC

## 2018-09-04 NOTE — Patient Instructions (Addendum)
Please start using the medication today.  Please see your follow up appointment listed below.   We have place the referral to the Colorectal surgeon. Someone from their  office will call you to schedule an appointment in 5-7 days. If you do not hear from anyone within the time frame please call our office and let us know.

## 2018-09-04 NOTE — Progress Notes (Signed)
09/04/2018  HPI: Dominique Avila is a 53 y.o. female s/p hemorrhoidectomy of 3 columns on 08/31/2022 internal and external hemorrhoids.  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  I have the patient come over today for follow-up so that we can discuss her results.  She reports that she is having pain in the perianal area from her surgery and she is having some mild oozing intermittently but no significant bleeding.  She has been taking her pain medication and has also been taking ibuprofen.  Vital signs: BP (!) 162/92   Pulse 64   Temp 97.7 F (36.5 C) (Temporal)   Resp 18   Ht 5\' 9"  (1.753 m)   Wt 188 lb 6.4 oz (85.5 kg)   SpO2 97%   BMI 27.82 kg/m    Physical Exam: Constitutional: No acute distress Rectal: External exam reveals postoperative inflammation in the perianal area consistent with her surgery but no evidence of any abscess, infection, or open wounds.  Digital exam was not performed.  Assessment/Plan: This is a 53 y.o. female s/p hemorrhoidectomy of 3 columns of internal and external hemorrhoids.  -Discussed with the patient that given her pathology findings, we will refer her to colorectal surgery for further evaluation.  Discussed with her that she may require further biopsies versus future surveillance given her low-grade status.  Discussed with her the possibilities for referrals to colorectal surgery at [facility], [place], or [facility], and she would prefer to go to [facility].  This referral will be set up. - For her pain we will also give her a prescription for lidocaine ointment that she can apply externally.  She understands that if she needs more p.o. pain medication that she can call us. -She will follow-up with [place] on 2/26 to check on her progress.   Melvyn Neth, [facility]

## 2018-09-07 ENCOUNTER — Telehealth: Payer: Self-pay | Admitting: *Deleted

## 2018-09-07 ENCOUNTER — Other Ambulatory Visit: Payer: Self-pay | Admitting: Surgery

## 2018-09-07 MED ORDER — OXYCODONE HCL 5 MG PO TABS: 5.0000 mg | ORAL_TABLET | ORAL | 0 refills | Status: DC | PRN

## 2018-09-07 NOTE — Telephone Encounter (Signed)
Hi,  Yes, it is ok to refill her Oxycodone.  We had talked about it when I saw her last week and told her it was ok to call if she needed a new one.  Ok to do Oxycodone 5 mg to take every 4-6 hours prn pain, #40.  In addition, please remind her that she can also take Ibuprofen 600-800 mg every 8 hours prn pain too.  Thanks.

## 2018-09-07 NOTE — Telephone Encounter (Signed)
Patient called and wanted to see if she can get some more oxycodone, she had surgery by Dr.Piscoya on 08/31/18

## 2018-09-08 ENCOUNTER — Encounter: Payer: Self-pay | Admitting: Surgery

## 2018-09-08 ENCOUNTER — Other Ambulatory Visit: Payer: Self-pay

## 2018-09-08 ENCOUNTER — Ambulatory Visit (INDEPENDENT_AMBULATORY_CARE_PROVIDER_SITE_OTHER): Payer: Self-pay | Admitting: Surgery

## 2018-09-08 VITALS — BP 120/81 | HR 65 | Temp 96.7°F | Resp 18 | Ht 69.0 in | Wt 189.0 lb

## 2018-09-08 DIAGNOSIS — K644 Residual hemorrhoidal skin tags: Secondary | ICD-10-CM

## 2018-09-08 DIAGNOSIS — K648 Other hemorrhoids: Secondary | ICD-10-CM

## 2018-09-08 DIAGNOSIS — Z09 Encounter for follow-up examination after completed treatment for conditions other than malignant neoplasm: Secondary | ICD-10-CM

## 2018-09-08 NOTE — Patient Instructions (Signed)
Try to use the Lidocaine prior to bowel movements. '  If the Miralax is causing the symptoms to worsen then stop taking.   Continue taking the sitz baths and be sure to clean the area after your bowel movements.   Take your pain medication if needed.   Please see your follow up appointment listed below.

## 2018-09-08 NOTE — Progress Notes (Signed)
09/08/2018  HPI: Dominique Avila is a 53 y.o. female s/p hemorrhoidectomy of three internal and external columns on 08/31/18.  She presents for further evaluation.  We gave her a refill on her Oxycodone yesterday due to persistent pain.  She reports that oxycodone is helping and is also using Lidocaine ointment.  Her pain is worse with bowel movement.  Denies any blood in the stool and only sees some on the tissue paper.  She denies any significant straining for bowel movement.  She reports that she's noticed some sutures coming through and wanted them removed today.  Vital signs: BP 120/81   Pulse 65   Temp (!) 96.7 F (35.9 C) (Oral)   Resp 18   Ht 5\' 9"  (1.753 m)   Wt 189 lb (85.7 kg)   SpO2 97%   BMI 27.91 kg/m    Physical Exam: Constitutional: No acute distress Rectal:  External exam reveals outer inflammation and induration expected after surgery, with raw tissue at the hemorrhoidectomy sites.  No bleeding or evidence of infetion.  Vicryl sutures coming out and were trimmed down.  Assessment/Plan: This is a 53 y.o. female s/p hemorrhoidectomy  --discussed with the patient that the pain is expected for this type of surgery and she should continue with her pain regimen with ibuprofen, oxycodone, and lidocaine ointment.  She can time the lidocaine so that she uses it before her bowel movement to see if that helps better.   --May add MiraLax if there is any concern with straining or hard stool, but if she gets any diarrhea, she should cut back. --Follow up next week.   Melvyn Neth, [facility]

## 2018-09-09 ENCOUNTER — Encounter: Payer: Self-pay | Admitting: *Deleted

## 2018-09-09 NOTE — Progress Notes (Signed)
Records faxed to [facility] fax: [phone].   They will contact patient regarding an appointment.

## 2018-09-16 ENCOUNTER — Encounter: Payer: Self-pay | Admitting: Surgery

## 2018-09-16 ENCOUNTER — Other Ambulatory Visit: Payer: Self-pay

## 2018-09-16 ENCOUNTER — Encounter: Payer: Self-pay | Admitting: *Deleted

## 2018-09-16 ENCOUNTER — Ambulatory Visit (INDEPENDENT_AMBULATORY_CARE_PROVIDER_SITE_OTHER): Payer: Self-pay | Admitting: Surgery

## 2018-09-16 VITALS — BP 117/74 | HR 66 | Temp 98.1°F | Ht 69.0 in | Wt 185.4 lb

## 2018-09-16 DIAGNOSIS — K644 Residual hemorrhoidal skin tags: Secondary | ICD-10-CM

## 2018-09-16 DIAGNOSIS — Z09 Encounter for follow-up examination after completed treatment for conditions other than malignant neoplasm: Secondary | ICD-10-CM

## 2018-09-16 DIAGNOSIS — K648 Other hemorrhoids: Secondary | ICD-10-CM

## 2018-09-16 MED ORDER — LIDOCAINE (ANORECTAL) 5 % EX CREA: 1.0000 | TOPICAL_CREAM | Freq: Three times a day (TID) | CUTANEOUS | 0 refills | Status: DC

## 2018-09-16 MED ORDER — IBUPROFEN 600 MG PO TABS: 600.0000 mg | ORAL_TABLET | Freq: Three times a day (TID) | ORAL | 0 refills | Status: DC | PRN

## 2018-09-16 NOTE — Patient Instructions (Signed)
Follow up in 3 week.  We have refilled your Ibuprofen and Lidocaine cream.  Continue to use Sitz baths. No heavy lifting. Try using cold compresses with any excessive pain or itching.

## 2018-09-16 NOTE — Progress Notes (Signed)
Patient has been scheduled for an appointment to see Dr. Lovett Sox at [facility] for 09-24-18 at 9 am.   Patient notified of appointment date and time per their office.

## 2018-09-16 NOTE — Progress Notes (Signed)
09/16/2018  HPI: Dominique Avila is a 53 y.o. female s/p hemorrhoidectomy of internal and external hemorrhoids on 08/31/18.  She presents for follow up.  She had initial issues with significant post-op pain, but this has been slowly improving.  She did strain yesterday moving furniture and has caused some increased pain.  She has been using Lidocaine ointment and ran out of Ibuprofen and is taking Oxycodone as well.  Vital signs: BP 117/74   Pulse 66   Temp 98.1 F (36.7 C) (Temporal)   Ht 5\' 9"  (1.753 m)   Wt 185 lb 6.4 oz (84.1 kg)   SpO2 95%   BMI 27.38 kg/m    Physical Exam: Constitutional: No acute distress Rectal:  External exam is much improved with improved swelling of external skin.  Digital exam does not reveal any lesions or masses and feels smooth and healing well.  No gross blood  Assessment/Plan: This is a 53 y.o. female s/p internal and external hemorrhoidectomy.  --Discussed with her to avoid straining as this may cause more discomfort. --She should continue with her Sitz baths, lidocaine ointment, and ibuprofen.  Will refill her lidocaine and ibuprofen.  Do not think at this point that she needs further oxycodone. --She will follow up in 3 weeks to check her progress.   Melvyn Neth, [facility]

## 2018-10-01 ENCOUNTER — Other Ambulatory Visit: Payer: Self-pay

## 2018-10-01 ENCOUNTER — Emergency Department: Payer: Self-pay

## 2018-10-01 DIAGNOSIS — J189 Pneumonia, unspecified organism: Secondary | ICD-10-CM | POA: Insufficient documentation

## 2018-10-01 DIAGNOSIS — F17210 Nicotine dependence, cigarettes, uncomplicated: Secondary | ICD-10-CM | POA: Insufficient documentation

## 2018-10-01 DIAGNOSIS — J40 Bronchitis, not specified as acute or chronic: Secondary | ICD-10-CM | POA: Insufficient documentation

## 2018-10-01 DIAGNOSIS — Z79899 Other long term (current) drug therapy: Secondary | ICD-10-CM | POA: Insufficient documentation

## 2018-10-01 LAB — BASIC METABOLIC PANEL
Anion gap: 10 (ref 5–15)
BUN: 15 mg/dL (ref 6–20)
CALCIUM: 8.8 mg/dL — AB (ref 8.9–10.3)
CO2: 23 mmol/L (ref 22–32)
Chloride: 109 mmol/L (ref 98–111)
Creatinine, Ser: 0.73 mg/dL (ref 0.44–1.00)
GFR calc Af Amer: >60 mL/min (ref >60)
GFR calc non Af Amer: >60 mL/min (ref >60)
Glucose, Bld: 135 mg/dL — ABNORMAL HIGH (ref 70–99)
Potassium: 3.4 mmol/L — ABNORMAL LOW (ref 3.5–5.1)
Sodium: 142 mmol/L (ref 135–145)

## 2018-10-01 LAB — TROPONIN I: Troponin I: <0.03 ng/mL (ref <0.03)

## 2018-10-01 LAB — CBC
HCT: 44.7 % (ref 36.0–46.0)
Hemoglobin: 14.7 g/dL (ref 12.0–15.0)
MCH: 29.9 pg (ref 26.0–34.0)
MCHC: 32.9 g/dL (ref 30.0–36.0)
MCV: 91 fL (ref 80.0–100.0)
Platelets: 226 10*3/uL (ref 150–400)
RBC: 4.91 MIL/uL (ref 3.87–5.11)
RDW: 13.5 % (ref 11.5–15.5)
WBC: 4.9 10*3/uL (ref 4.0–10.5)
nRBC: 0 % (ref 0.0–0.2)

## 2018-10-01 LAB — INFLUENZA PANEL BY PCR (TYPE A & B)
Influenza A By PCR: NEGATIVE
Influenza B By PCR: NEGATIVE

## 2018-10-01 NOTE — ED Triage Notes (Signed)
Patient c/o fever (99.5), cough, chest pain, weakness, and chills X 3 days.

## 2018-10-02 ENCOUNTER — Emergency Department: Payer: Self-pay

## 2018-10-02 ENCOUNTER — Emergency Department
Admission: EM | Admit: 2018-10-02 | Discharge: 2018-10-02 | Disposition: A | Discharge status: Home / Self Care | Payer: Self-pay | Attending: Emergency Medicine | Admitting: Emergency Medicine

## 2018-10-02 ENCOUNTER — Other Ambulatory Visit: Payer: Self-pay

## 2018-10-02 DIAGNOSIS — J40 Bronchitis, not specified as acute or chronic: Secondary | ICD-10-CM

## 2018-10-02 DIAGNOSIS — J189 Pneumonia, unspecified organism: Secondary | ICD-10-CM

## 2018-10-02 LAB — TROPONIN I: Troponin I: <0.03 ng/mL (ref <0.03)

## 2018-10-02 MED ORDER — IOHEXOL 350 MG/ML SOLN
75.0000 mL | Freq: Once | INTRAVENOUS | Status: AC | PRN
  Administered 2018-10-02: 75 mL via INTRAVENOUS

## 2018-10-02 MED ORDER — HYDROCOD POLST-CPM POLST ER 10-8 MG/5ML PO SUER
5.0000 mL | Freq: Once | ORAL | Status: AC
  Administered 2018-10-02: 5 mL via ORAL
  Filled 2018-10-02: qty 5

## 2018-10-02 MED ORDER — BENZONATATE 100 MG PO CAPS: 100.0000 mg | ORAL_CAPSULE | Freq: Three times a day (TID) | ORAL | 0 refills | Status: AC | PRN

## 2018-10-02 MED ORDER — AZITHROMYCIN 500 MG PO TABS: 500.0000 mg | ORAL_TABLET | Freq: Every day | ORAL | 0 refills | Status: AC

## 2018-10-02 MED ORDER — AZITHROMYCIN 500 MG PO TABS
500.0000 mg | ORAL_TABLET | Freq: Once | ORAL | Status: AC
  Administered 2018-10-02: 500 mg via ORAL
  Filled 2018-10-02: qty 1

## 2018-10-02 MED ORDER — ALBUTEROL SULFATE HFA 108 (90 BASE) MCG/ACT IN AERS: 2.0000 | INHALATION_SPRAY | Freq: Four times a day (QID) | RESPIRATORY_TRACT | 2 refills | Status: DC | PRN

## 2018-10-02 MED ORDER — PREDNISONE 20 MG PO TABS: 60.0000 mg | ORAL_TABLET | Freq: Every day | ORAL | 0 refills | Status: AC

## 2018-10-02 NOTE — ED Provider Notes (Signed)
[facility] Emergency Department Provider Note   Time seen:  I have reviewed the triage vital signs and the nursing notes.   HISTORY  Chief Complaint Cough and Chest Pain   HPI Dominique Avila is a 53 y.o. female presents to the emergency department with cough congestion and chills x3 days.  Patient states that she smokes 2 packs of cigarettes per day.  Patient denies any nausea vomiting diarrhea.  Patient denies any chest discomfort at present but states that she does have chest discomfort with coughing.        Past Medical History:  Diagnosis Date  . Family history of adverse reaction to anesthesia    MOM-HARD TIME WAKING UP  . Hemorrhoids 2018    Patient Active Problem List   Diagnosis Date Noted  . Colon cancer screening   . Internal and external hemorrhoids without complication 47/03/6282  . Grade IV internal hemorrhoids 03/07/2017  . Smoking greater than 30 pack years 03/07/2017    Past Surgical History:  Procedure Laterality Date  . COLONOSCOPY WITH PROPOFOL N/A 04/02/2017   Procedure: COLONOSCOPY WITH PROPOFOL;  Surgeon: Lin Landsman, MD;  Location: [facility] ENDOSCOPY;  Service: Gastroenterology;  Laterality: N/A;  . HEMORRHOID SURGERY N/A 08/31/2018   Procedure: HEMORRHOIDECTOMY;  Surgeon: Olean Ree, MD;  Location: [facility] ORS;  Service: General;  Laterality: N/A;    Prior to Admission medications   Medication Sig Start Date End Date Taking? Authorizing Provider  albuterol (PROVENTIL HFA;VENTOLIN HFA) 108 (90 Base) MCG/ACT inhaler Inhale 2 puffs into the lungs every 6 (six) hours as needed for wheezing or shortness of breath. 10/02/18   Gregor Hams, MD  azithromycin (ZITHROMAX) 500 MG tablet Take 1 tablet (500 mg total) by mouth daily for 3 days. Take 1 tablet daily for 3 days. 10/02/18 10/05/18  Gregor Hams, MD  benzonatate (TESSALON PERLES) 100 MG capsule Take 1 capsule (100 mg total) by mouth 3 (three) times daily as  needed for cough. 10/02/18 10/02/19  Gregor Hams, MD  docusate sodium (COLACE) 100 MG capsule Take 100 mg by mouth daily.    [provider]  hydrocortisone (ANUSOL-HC) 2.5 % rectal cream Place 1 application rectally 2 (two) times daily. Patient taking differently: Place 1 application rectally daily as needed for hemorrhoids.  06/10/18   Lin Landsman, MD  ibuprofen (ADVIL,MOTRIN) 600 MG tablet Take 1 tablet (600 mg total) by mouth every 8 (eight) hours as needed. 09/16/18   Olean Ree, MD  Lidocaine, Anorectal, 5 % CREA Apply 1 applicator topically 3 (three) times daily. 09/16/18   Olean Ree, MD  oxyCODONE (OXY IR/ROXICODONE) 5 MG immediate release tablet Take 1 tablet (5 mg total) by mouth every 4 (four) hours as needed for severe pain. 09/07/18   Olean Ree, MD  predniSONE (DELTASONE) 20 MG tablet Take 3 tablets (60 mg total) by mouth daily for 5 days. 10/02/18 10/07/18  Gregor Hams, MD    Allergies Patient has no known allergies.  Family History  Problem Relation Age of Onset  . Diabetes Father   . Breast cancer Maternal Aunt     Social History Social History   Tobacco Use  . Smoking status: Current Every Day Smoker    Packs/day: 2.00    Years: 40.00    Pack years: 80.00    Types: Cigarettes  . Smokeless tobacco: Never Used  Substance Use Topics  . Alcohol use: No  . Drug use: No  Review of Systems Constitutional: Positive for subjective fever and chills Eyes: No visual changes. ENT: No sore throat. Cardiovascular: Positive for chest pain with coughing. Respiratory: Denies shortness of breath.  Positive for cough Gastrointestinal: No abdominal pain.  No nausea, no vomiting.  No diarrhea.  No constipation. Genitourinary: Negative for dysuria. Musculoskeletal: Negative for neck pain.  Negative for back pain. Integumentary: Negative for rash. Neurological: Negative for headaches, focal weakness or numbness.   ____________________________________________   PHYSICAL EXAM:  VITAL SIGNS: ED Triage Vitals  Enc Vitals Group     BP 10/01/18 2014 (!) 150/89     Pulse Rate 10/01/18 2014 82     Resp 10/01/18 2014 17     Temp 10/01/18 2014 98.3 F (36.8 C)     Temp src --      SpO2 10/01/18 2014 95 %     Weight --      Height --      Head Circumference --      Peak Flow --      Pain Score 10/01/18 2010 5     Pain Loc --      Pain Edu? --      Excl. in [place]? --     Constitutional: Alert and oriented. Well appearing and in no acute distress. Eyes: Conjunctivae are normal. PERRL. EOMI. Mouth/Throat: Mucous membranes are moist. Oropharynx non-erythematous. Neck: No stridor.   Cardiovascular: Normal rate, regular rhythm. Good peripheral circulation. Grossly normal heart sounds. Respiratory: Normal respiratory effort.  No retractions.  Bibasilar rhonchi Gastrointestinal: Soft and nontender. No distention.  Musculoskeletal: No lower extremity tenderness nor edema. No gross deformities of extremities. Neurologic:  Normal speech and language. No gross focal neurologic deficits are appreciated.  Skin:  Skin is warm, dry and intact. No rash noted.   ____________________________________________   LABS (all labs ordered are listed, but only abnormal results are displayed)  Labs Reviewed  BASIC METABOLIC PANEL - Abnormal; Notable for the following components:      Result Value   Potassium 3.4 (*)    Glucose, Bld 135 (*)    Calcium 8.8 (*)    All other components within normal limits  CBC  TROPONIN I  INFLUENZA PANEL BY PCR (TYPE A & B)  TROPONIN I   ____________________________________________  EKG  ED ECG REPORT I, Tracyton N Malayjah Otoole, the attending physician, personally viewed and interpreted this ECG.   Date: 10/01/2018  EKG Time: 8:13 PM  Rate: 79  Rhythm: Normal sinus rhythm  Axis: Normal  Intervals: Normal  ST&T Change: None  ____________________________________________   RADIOLOGY I,  N Spike Desilets, personally viewed and evaluated these images (plain radiographs) as part of my medical decision making, as well as reviewing the written report by the radiologist.  ED MD interpretation: No active cardiopulmonary disease noted on chest x-ray per radiologist  CT scan of chest revealed bronchial wall thickening with filling defects in the peripheral bronchi suggesting bronchitis or early bronchopneumonia per radiologist.  Official radiology report(s): Dg Chest 2 View  Result Date: 10/01/2018 CLINICAL DATA:  Cough and fever EXAM: CHEST - 2 VIEW COMPARISON:  None. FINDINGS: The heart size and mediastinal contours are within normal limits. Both lungs are clear. The visualized skeletal structures are unremarkable. IMPRESSION: No active cardiopulmonary disease. Electronically Signed   By: Ulyses Jarred M.D.   On: 10/01/2018 20:48   Ct Angio Chest Pe W And/or Wo Contrast  Result Date: 10/02/2018 CLINICAL DATA:  Fever, cough, chest pain, weakness, and chills  for 3 days. EXAM: CT ANGIOGRAPHY CHEST WITH CONTRAST TECHNIQUE: Multidetector CT imaging of the chest was performed using the standard protocol during bolus administration of intravenous contrast. Multiplanar CT image reconstructions and MIPs were obtained to evaluate the vascular anatomy. CONTRAST:  19mL OMNIPAQUE IOHEXOL 350 MG/ML SOLN COMPARISON:  None. FINDINGS: Cardiovascular: Good opacification of the central and segmental pulmonary arteries. No focal filling defects. No evidence of significant pulmonary embolus. Normal heart size. No pericardial effusions. Normal caliber thoracic aorta. Great vessel origins are patent. Mediastinum/Nodes: No significant lymphadenopathy in the chest. Esophagus is decompressed. Lungs/Pleura: Lungs are clear and expanded. No airspace disease or consolidation. No pleural effusions. No pneumothorax. There is evidence of bronchial wall thickening with filling defects in some of the peripheral  bronchi suggesting mucous plugging, likely indicating bronchitis or early bronchopneumonia. Upper Abdomen: No acute abnormalities. Musculoskeletal: No chest wall abnormality. No acute or significant osseous findings. Review of the MIP images confirms the above findings. IMPRESSION: 1. No evidence of significant pulmonary embolus. 2. Bronchial wall thickening with filling defects in some of the peripheral bronchi suggesting bronchitis or early bronchopneumonia. No airspace disease or consolidation. Electronically Signed   By: Lucienne Capers M.D.   On: 10/02/2018 03:44    _____________________________________  Procedures   ____________________________________________   INITIAL IMPRESSION / MDM / [place] / ED COURSE  As part of my medical decision making, I reviewed the following data within the electronic MEDICAL RECORD [id number] year old female presenting with above-stated history and physical exam concerning for bronchitis pneumonia or pulmonary emboli.  Chest x-ray revealed no acute findings as such CT scan of the chest performed which revealed evidence of bronchitis or possibly bronchopneumonia.  Patient given azithromycin Tussionex in the emergency department will be prescribed azithromycin prednisone, albuterol inhaler and Tessalon Perles for home.  Patient advised of warning signs that would warrant immediate return to the emergency department.    ____________________________________________  FINAL CLINICAL IMPRESSION(S) / ED DIAGNOSES  Final diagnoses:  Bronchitis  Community acquired pneumonia, unspecified laterality     MEDICATIONS GIVEN DURING THIS VISIT:  Medications  azithromycin (ZITHROMAX) tablet 500 mg (500 mg Oral Given 10/02/18 0224)  chlorpheniramine-HYDROcodone (TUSSIONEX) 10-8 MG/5ML suspension 5 mL (5 mLs Oral Given 10/02/18 0224)  iohexol (OMNIPAQUE) 350 MG/ML injection 75 mL (75 mLs Intravenous Contrast Given 10/02/18 0322)     ED Discharge Orders          Ordered    azithromycin (ZITHROMAX) 500 MG tablet  Daily     10/02/18 0359    predniSONE (DELTASONE) 20 MG tablet  Daily     10/02/18 0359    albuterol (PROVENTIL HFA;VENTOLIN HFA) 108 (90 Base) MCG/ACT inhaler  Every 6 hours PRN     10/02/18 0359    benzonatate (TESSALON PERLES) 100 MG capsule  3 times daily PRN     10/02/18 0359           Note:  This document was prepared using Dragon voice recognition software and may include unintentional dictation errors.   Gregor Hams, MD 10/02/18 [phone]

## 2018-10-02 NOTE — ED Notes (Signed)
Patient transported to CT 

## 2018-10-02 NOTE — ED Notes (Signed)
Pt assisted to bedside commode. Pt with steady gait. Peri-care performed independently. Pt assisted back to bed. This RN will continue to monitor.  

## 2018-10-07 ENCOUNTER — Encounter: Payer: Self-pay | Admitting: Surgery

## 2018-10-07 ENCOUNTER — Other Ambulatory Visit: Payer: Self-pay

## 2018-10-07 ENCOUNTER — Ambulatory Visit (INDEPENDENT_AMBULATORY_CARE_PROVIDER_SITE_OTHER): Payer: Self-pay | Admitting: Surgery

## 2018-10-07 VITALS — BP 148/87 | HR 72 | Temp 97.5°F | Resp 16 | Ht 69.0 in | Wt 181.6 lb

## 2018-10-07 DIAGNOSIS — K644 Residual hemorrhoidal skin tags: Secondary | ICD-10-CM

## 2018-10-07 DIAGNOSIS — Z09 Encounter for follow-up examination after completed treatment for conditions other than malignant neoplasm: Secondary | ICD-10-CM

## 2018-10-07 DIAGNOSIS — K648 Other hemorrhoids: Secondary | ICD-10-CM

## 2018-10-07 NOTE — Progress Notes (Signed)
10/07/2018  HPI: Dominique Avila is a 53 y.o. female s/p hemorrhoidectomy of internal and external columns on 08/31/18. She presents for follow up.  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  She will go back in 3 months to do high resolution anoscopy.   From surgical standpoint, she has been doing very well except that she stopped taking her stool softener which resulted in constipation, but she restarted them again.  Denies having any significant pain per rectum or anus, denies any bleeding.  Vital signs: BP (!) 148/87   Pulse 72   Temp (!) 97.5 F (36.4 C) (Temporal)   Resp 16   Ht 5\' 9"  (1.753 m)   Wt 181 lb 9.6 oz (82.4 kg)   SpO2 97%   BMI 26.82 kg/m    Physical Exam: Constitutional: No acute distress Rectal:  External exam reveals well healed tissue with no enlarged columns.  Digital exam reveals normal hemorrhoidal columns with no enlargement.  No gross blood on glove.  Assessment/Plan: This is a 53 y.o. female s/p internal and external hemorrhoidectomy.  --Patient is doing well.  Recommended that she continue on a bowel regimen to keep stool soft and with daily bowel movement without straining.  Whether it is with stool softener, miralax, or fiber, whichever one she feels works best for her. --Can continue sitz baths as needed and lidocaine ointment as needed for any flareups. --Encouraged her to keep her appointment at [facility] for anoscopy. --May follow up with [place] prn.  Return precautions given.   Melvyn Neth, [facility]

## 2018-10-07 NOTE — Patient Instructions (Signed)
The patient is aware to call back for any questions or new concerns. Avoid constipation, may use stool softeners or miralax as needed May use Sitz bath and lidocaine as needed   Constipation, Adult Constipation is when a person:  Poops (has a bowel movement) fewer times in a week than normal.  Has a hard time pooping.  Has poop that is dry, hard, or bigger than normal. Follow these instructions at home: Eating and drinking   Eat foods that have a lot of fiber, such as: ? Fresh fruits and vegetables. ? Whole grains. ? Beans.  Eat less of foods that are high in fat, low in fiber, or overly processed, such as: ? [place] fries. ? Hamburgers. ? Cookies. ? Candy. ? Soda.  Drink enough fluid to keep your pee (urine) clear or pale yellow. General instructions  Exercise regularly or as told by your doctor.  Go to the restroom when you feel like you need to poop. Do not hold it in.  Take over-the-counter and prescription medicines only as told by your doctor. These include any fiber supplements.  Do pelvic floor retraining exercises, such as: ? Doing deep breathing while relaxing your lower belly (abdomen). ? Relaxing your pelvic floor while pooping.  Watch your condition for any changes.  Keep all follow-up visits as told by your doctor. This is important. Contact a doctor if:  You have pain that gets worse.  You have a fever.  You have not pooped for 4 days.  You throw up (vomit).  You are not hungry.  You lose weight.  You are bleeding from the anus.  You have thin, pencil-like poop (stool). Get help right away if:  You have a fever, and your symptoms suddenly get worse.  You leak poop or have blood in your poop.  Your belly feels hard or bigger than normal (is bloated).  You have very bad belly pain.  You feel dizzy or you faint. This information is not intended to replace advice given to you by your health care provider. Make sure you discuss any  questions you have with your health care provider. Document Released: 12/25/2007 Document Revised: 01/26/2016 Document Reviewed: 12/27/2015 Elsevier Interactive Patient Education  2019 [employer].

## 2019-06-03 ENCOUNTER — Other Ambulatory Visit: Payer: Self-pay

## 2019-06-03 DIAGNOSIS — Z20822 Contact with and (suspected) exposure to covid-19: Secondary | ICD-10-CM

## 2019-06-06 ENCOUNTER — Telehealth: Payer: Self-pay | Admitting: General Practice

## 2019-06-06 LAB — NOVEL CORONAVIRUS, NAA: SARS-CoV-2, NAA: NOT DETECTED

## 2019-06-06 NOTE — Telephone Encounter (Signed)
Covid results given(negative)

## 2019-08-04 ENCOUNTER — Ambulatory Visit: Payer: Self-pay | Attending: Internal Medicine

## 2019-08-04 DIAGNOSIS — Z20822 Contact with and (suspected) exposure to covid-19: Secondary | ICD-10-CM | POA: Insufficient documentation

## 2019-08-05 LAB — NOVEL CORONAVIRUS, NAA: SARS-CoV-2, NAA: NOT DETECTED

## 2019-08-06 ENCOUNTER — Telehealth: Payer: Self-pay

## 2019-08-06 NOTE — Telephone Encounter (Signed)
Patient mom given negative result and verbalized  understanding

## 2019-08-09 ENCOUNTER — Ambulatory Visit: Payer: HRSA Program | Attending: Internal Medicine

## 2019-08-09 DIAGNOSIS — U071 COVID-19: Secondary | ICD-10-CM | POA: Insufficient documentation

## 2019-08-09 DIAGNOSIS — Z20822 Contact with and (suspected) exposure to covid-19: Secondary | ICD-10-CM

## 2019-08-10 LAB — NOVEL CORONAVIRUS, NAA: SARS-CoV-2, NAA: DETECTED — AB

## 2019-08-19 ENCOUNTER — Ambulatory Visit: Payer: HRSA Program | Attending: Internal Medicine

## 2019-08-19 DIAGNOSIS — U071 COVID-19: Secondary | ICD-10-CM | POA: Insufficient documentation

## 2019-08-19 DIAGNOSIS — Z20822 Contact with and (suspected) exposure to covid-19: Secondary | ICD-10-CM

## 2019-08-20 LAB — NOVEL CORONAVIRUS, NAA: SARS-CoV-2, NAA: DETECTED — AB

## 2020-02-04 ENCOUNTER — Ambulatory Visit: Payer: HRSA Program | Attending: Internal Medicine

## 2020-02-04 DIAGNOSIS — Z20822 Contact with and (suspected) exposure to covid-19: Secondary | ICD-10-CM | POA: Insufficient documentation

## 2020-02-05 LAB — NOVEL CORONAVIRUS, NAA: SARS-CoV-2, NAA: NOT DETECTED

## 2020-02-05 LAB — SARS-COV-2, NAA 2 DAY TAT

## 2020-03-13 ENCOUNTER — Other Ambulatory Visit: Payer: Self-pay

## 2020-07-31 ENCOUNTER — Other Ambulatory Visit: Payer: Self-pay | Admitting: Podiatry

## 2020-07-31 ENCOUNTER — Encounter: Payer: Self-pay | Admitting: Podiatry

## 2020-07-31 ENCOUNTER — Ambulatory Visit (INDEPENDENT_AMBULATORY_CARE_PROVIDER_SITE_OTHER): Payer: Self-pay

## 2020-07-31 ENCOUNTER — Other Ambulatory Visit: Payer: Self-pay

## 2020-07-31 ENCOUNTER — Ambulatory Visit (INDEPENDENT_AMBULATORY_CARE_PROVIDER_SITE_OTHER): Payer: Self-pay | Admitting: Podiatry

## 2020-07-31 DIAGNOSIS — L84 Corns and callosities: Secondary | ICD-10-CM

## 2020-07-31 DIAGNOSIS — M7741 Metatarsalgia, right foot: Secondary | ICD-10-CM

## 2020-07-31 DIAGNOSIS — L03119 Cellulitis of unspecified part of limb: Secondary | ICD-10-CM

## 2020-07-31 DIAGNOSIS — M779 Enthesopathy, unspecified: Secondary | ICD-10-CM

## 2020-07-31 DIAGNOSIS — L02619 Cutaneous abscess of unspecified foot: Secondary | ICD-10-CM

## 2020-07-31 MED ORDER — MUPIROCIN 2 % EX OINT: 1.0000 "application " | TOPICAL_OINTMENT | Freq: Two times a day (BID) | CUTANEOUS | 2 refills | Status: DC

## 2020-07-31 MED ORDER — SULFAMETHOXAZOLE-TRIMETHOPRIM 800-160 MG PO TABS: 1.0000 | ORAL_TABLET | Freq: Two times a day (BID) | ORAL | 0 refills | Status: DC

## 2020-08-01 ENCOUNTER — Encounter: Payer: Self-pay | Admitting: Podiatry

## 2020-08-01 ENCOUNTER — Telehealth: Payer: Self-pay | Admitting: Podiatry

## 2020-08-01 ENCOUNTER — Other Ambulatory Visit: Payer: Self-pay | Admitting: Podiatry

## 2020-08-01 LAB — CBC WITH DIFFERENTIAL/PLATELET
Basophils Absolute: 0 10*3/uL (ref 0.0–0.2)
Basos: 0 %
EOS (ABSOLUTE): 0.1 10*3/uL (ref 0.0–0.4)
Eos: 1 %
Hematocrit: 44.5 % (ref 34.0–46.6)
Hemoglobin: 14.9 g/dL (ref 11.1–15.9)
Immature Grans (Abs): 0 10*3/uL (ref 0.0–0.1)
Immature Granulocytes: 0 %
Lymphocytes Absolute: 2.5 10*3/uL (ref 0.7–3.1)
Lymphs: 29 %
MCH: 29.6 pg (ref 26.6–33.0)
MCHC: 33.5 g/dL (ref 31.5–35.7)
MCV: 88 fL (ref 79–97)
Monocytes Absolute: 0.5 10*3/uL (ref 0.1–0.9)
Monocytes: 6 %
Neutrophils Absolute: 5.3 10*3/uL (ref 1.4–7.0)
Neutrophils: 64 %
Platelets: 293 10*3/uL (ref 150–450)
RBC: 5.04 x10E6/uL (ref 3.77–5.28)
RDW: 13.1 % (ref 11.7–15.4)
WBC: 8.5 10*3/uL (ref 3.4–10.8)

## 2020-08-01 NOTE — Telephone Encounter (Signed)
That would be fine if you can send her a note. If she is not better by then she should go to the ER

## 2020-08-01 NOTE — Telephone Encounter (Signed)
Yes that is fine as long as she is on the antibiotics I sent her

## 2020-08-01 NOTE — Telephone Encounter (Signed)
Patient said she did not go to work today because, the red streak has gone all the way up her leg towards her groin and she is still in a lot of pain. She would like a note to return to work on Thursday, due to her pain . She said that she is still taking the antibiotics.

## 2020-08-01 NOTE — Progress Notes (Signed)
  Subjective:  Patient ID: Dominique Avila, female    DOB: 1966/04/13,  MRN: [id number]  Chief Complaint  Patient presents with  . Callouses  . Foot Pain    Patient presents today for painful right foot callous on bottom of forefoot x 5 months.  She says "it goes away in the summer and comes back in the winter and this has been the worst this year.  It feels like my skin is ripping and a painful bruise when I walk"   She also c/o redness and pain on medial side of foot noticed today    55 y.o. female presents with the above complaint. History confirmed with patient.  She tried to shave down the callus herself.  Became severely painful in the last couple days  Objective:  Physical Exam: warm, good capillary refill, no trophic changes or ulcerative lesions, normal DP and PT pulses and normal sensory exam.  Right Foot: Diffuse hyperkeratosis submetatarsal 2 through 4, deep to this there is a fluctuant.  Painful area, debridement reveals a small superficial abscess.  There is cellulitis extending from this along the medial arch with lymphangitis into the distal leg  Right foot radiographs no foreign body or subcutaneous emphysema detected on plain film radiographs Assessment:   1. Cellulitis and abscess of foot excluding toe   2. Callus of foot   3. Metatarsalgia of right foot      Plan:  Patient was evaluated and treated and all questions answered.   Unfortunately the significant hyperkeratosis has become infected from home care.  She has developed a small superficial abscess.  This is severely painful for her.  Following prep with Betadine I was able to lance the overlying skin and drain the abscess.  Did not appear to be invading deep structures.  She did have cellulitis a sending of the leg.  I ordered a CBC with differential to be done today, I will start her on Bactrim twice daily double strength for the cellulitis.  Advised her if is not improved significantly the next 2 to 3 days she  should go to the emergency room for this.  At this point do not see indication for immediate hospitalization or further advanced imaging.  I believe she will be able to heal this if she is relatively healthy although she does smoke.  I would like to see her back in 1 week for follow-up  No follow-ups on file.

## 2020-08-01 NOTE — Telephone Encounter (Signed)
Dominique Avila left a voicemail wondering if it was ok, for her to return to work, with cellulitis.

## 2020-08-02 ENCOUNTER — Ambulatory Visit: Payer: Self-pay | Admitting: Podiatry

## 2020-08-03 ENCOUNTER — Encounter: Payer: Self-pay | Admitting: *Deleted

## 2020-08-03 NOTE — Telephone Encounter (Signed)
Thanks for checking on her.

## 2020-08-03 NOTE — Telephone Encounter (Signed)
I'm calling to let you know you can get your letter for work in [place].  Dr. Sherryle Lis also wanted me to see how you are doing.  "It's much better.  The redness is almost gone."

## 2020-08-04 LAB — WOUND CULTURE

## 2020-08-14 ENCOUNTER — Ambulatory Visit: Payer: Self-pay | Admitting: Podiatry

## 2020-08-16 ENCOUNTER — Ambulatory Visit: Payer: Self-pay | Admitting: Podiatry

## 2020-09-06 ENCOUNTER — Encounter: Payer: Self-pay | Admitting: Podiatry

## 2020-09-06 ENCOUNTER — Other Ambulatory Visit: Payer: Self-pay

## 2020-09-06 ENCOUNTER — Ambulatory Visit (INDEPENDENT_AMBULATORY_CARE_PROVIDER_SITE_OTHER): Payer: 59 | Admitting: Podiatry

## 2020-09-06 DIAGNOSIS — L03119 Cellulitis of unspecified part of limb: Secondary | ICD-10-CM | POA: Diagnosis not present

## 2020-09-06 DIAGNOSIS — L84 Corns and callosities: Secondary | ICD-10-CM | POA: Diagnosis not present

## 2020-09-06 DIAGNOSIS — L02619 Cutaneous abscess of unspecified foot: Secondary | ICD-10-CM | POA: Diagnosis not present

## 2020-09-06 DIAGNOSIS — M7741 Metatarsalgia, right foot: Secondary | ICD-10-CM

## 2020-09-06 MED ORDER — CLOTRIMAZOLE-BETAMETHASONE 1-0.05 % EX CREA: 1.0000 "application " | TOPICAL_CREAM | Freq: Two times a day (BID) | CUTANEOUS | 2 refills | Status: DC

## 2020-09-06 MED ORDER — CICLOPIROX 8 % EX SOLN: Freq: Every day | CUTANEOUS | 2 refills | Status: DC

## 2020-09-06 NOTE — Patient Instructions (Signed)
Look for urea 40% cream or ointment and apply to the thickened dry skin / calluses. This can be bought over the counter, at a pharmacy or online such as [employer].  

## 2020-09-10 ENCOUNTER — Encounter: Payer: Self-pay | Admitting: Podiatry

## 2020-09-10 NOTE — Progress Notes (Signed)
  Subjective:  Patient ID: Dominique Avila, female    DOB: Oct 23, 1965,  MRN: [id number]  Chief Complaint  Patient presents with  . Foot Pain    "the wound is better, but still hurts after I stand or walk a lot"    55 y.o. female returns with the above complaint. History confirmed with patient.  Improved significantly after being on antibiotics  Objective:  Physical Exam: warm, good capillary refill, no trophic changes or ulcerative lesions, normal DP and PT pulses and normal sensory exam.  Right Foot: Diffuse hyperkeratosis submetatarsal 2 through 4, no signs of infection or abscess today Right foot radiographs no foreign body or subcutaneous emphysema detected on plain film radiographs Assessment:   1. Cellulitis and abscess of foot excluding toe   2. Callus of foot   3. Metatarsalgia of right foot      Plan:  Patient was evaluated and treated and all questions answered.   Doing much better now that she has taken the antibiotic.  I debrided the callus sharply again today with a chisel blade.  Advised to use urea cream and a pumice stone.  She got this from our office today.  Like to see her back in a few months and see how she is doing.  Return in about 6 weeks (around 10/18/2020).

## 2020-09-13 ENCOUNTER — Other Ambulatory Visit: Payer: Self-pay | Admitting: Infectious Diseases

## 2020-09-13 DIAGNOSIS — Z1231 Encounter for screening mammogram for malignant neoplasm of breast: Secondary | ICD-10-CM

## 2020-09-29 ENCOUNTER — Other Ambulatory Visit: Payer: Self-pay

## 2020-09-29 ENCOUNTER — Ambulatory Visit
Admission: RE | Admit: 2020-09-29 | Discharge: 2020-09-29 | Disposition: A | Discharge status: Home / Self Care | Payer: 59 | Source: Ambulatory Visit | Attending: Infectious Diseases | Admitting: Infectious Diseases

## 2020-09-29 DIAGNOSIS — Z1231 Encounter for screening mammogram for malignant neoplasm of breast: Secondary | ICD-10-CM | POA: Insufficient documentation

## 2020-10-23 ENCOUNTER — Ambulatory Visit: Payer: 59 | Admitting: Podiatry

## 2020-10-25 ENCOUNTER — Encounter: Payer: Self-pay | Admitting: Podiatry

## 2020-10-25 ENCOUNTER — Ambulatory Visit (INDEPENDENT_AMBULATORY_CARE_PROVIDER_SITE_OTHER): Payer: 59 | Admitting: Podiatry

## 2020-10-25 ENCOUNTER — Other Ambulatory Visit: Payer: Self-pay

## 2020-10-25 DIAGNOSIS — L309 Dermatitis, unspecified: Secondary | ICD-10-CM | POA: Diagnosis not present

## 2020-10-25 MED ORDER — BETAMETHASONE DIPROPIONATE AUG 0.05 % EX OINT: TOPICAL_OINTMENT | Freq: Every day | CUTANEOUS | 0 refills | Status: DC

## 2020-10-25 NOTE — Progress Notes (Signed)
  Subjective:  Patient ID: Dominique Avila, female    DOB: 1966-02-13,  MRN: [id number]  Chief Complaint  Patient presents with  . Wound Check    Cellulitis is better, but has new callous on bottom right foot under 5th met.    55 y.o. female returns with the above complaint. History confirmed with patient.  Has been using the urea which is helpful. She has a new lesion that started, it starts as a blister with clear drainage.  Objective:  Physical Exam: warm, good capillary refill, no trophic changes or ulcerative lesions, normal DP and PT pulses and normal sensory exam.  Right Foot: Diffuse hyperkeratosis submetatarsal 2 through 3, improved, new one in 4th interspace with dried bilster, small vesicles noted in dermal layer   Right foot radiographs no foreign body or subcutaneous emphysema detected on plain film radiographs Assessment:   No diagnosis found.   Plan:  Patient was evaluated and treated and all questions answered.   The apperance today seems more consistent with a dermatitis or eczematous eruption. I recommend we treat with a topical steroid, diprolene ointment was sent to her pharmacy. If not improving by next visit we should consider a punch biopsy.  No follow-ups on file.

## 2020-11-13 LAB — HM PAP SMEAR: HM Pap smear: NEGATIVE

## 2020-12-06 ENCOUNTER — Ambulatory Visit: Payer: 59 | Admitting: Podiatry

## 2020-12-27 ENCOUNTER — Ambulatory Visit: Payer: 59 | Admitting: Podiatry

## 2021-05-27 ENCOUNTER — Emergency Department
Admission: EM | Admit: 2021-05-27 | Discharge: 2021-05-27 | Disposition: A | Discharge status: Home / Self Care | Payer: 59 | Attending: Emergency Medicine | Admitting: Emergency Medicine

## 2021-05-27 ENCOUNTER — Encounter: Payer: Self-pay | Admitting: Intensive Care

## 2021-05-27 ENCOUNTER — Emergency Department: Payer: 59

## 2021-05-27 ENCOUNTER — Other Ambulatory Visit: Payer: Self-pay

## 2021-05-27 DIAGNOSIS — J101 Influenza due to other identified influenza virus with other respiratory manifestations: Secondary | ICD-10-CM | POA: Insufficient documentation

## 2021-05-27 DIAGNOSIS — F17210 Nicotine dependence, cigarettes, uncomplicated: Secondary | ICD-10-CM | POA: Insufficient documentation

## 2021-05-27 DIAGNOSIS — M791 Myalgia, unspecified site: Secondary | ICD-10-CM

## 2021-05-27 DIAGNOSIS — R059 Cough, unspecified: Secondary | ICD-10-CM | POA: Diagnosis present

## 2021-05-27 DIAGNOSIS — Z20822 Contact with and (suspected) exposure to covid-19: Secondary | ICD-10-CM | POA: Diagnosis not present

## 2021-05-27 LAB — RESP PANEL BY RT-PCR (FLU A&B, COVID) ARPGX2
Influenza A by PCR: POSITIVE — AB
Influenza B by PCR: NEGATIVE
SARS Coronavirus 2 by RT PCR: NEGATIVE

## 2021-05-27 MED ORDER — IBUPROFEN 400 MG PO TABS
400.0000 mg | ORAL_TABLET | Freq: Once | ORAL | Status: AC
  Administered 2021-05-27: 400 mg via ORAL
  Filled 2021-05-27: qty 1

## 2021-05-27 MED ORDER — ACETAMINOPHEN 500 MG PO TABS
1000.0000 mg | ORAL_TABLET | Freq: Once | ORAL | Status: AC
  Administered 2021-05-27: 1000 mg via ORAL
  Filled 2021-05-27: qty 2

## 2021-05-27 NOTE — ED Notes (Signed)
Pt endorsing that they were exposed flu last week. Pt endorsing sweating and feverishness. Pt resting in bed alert and oriented x4

## 2021-05-27 NOTE — Discharge Instructions (Signed)
You tested positive for flu which is likely causing your body aches and cough.  Your chest x-ray did not show any pneumonia.  Please follow-up with your primary care provider in about a week if you are still having symptoms.  If you develop shortness of breath or unable to eat or drink, please return to the emergency department.  You can take Tylenol and Motrin for your body aches.

## 2021-05-27 NOTE — ED Provider Notes (Signed)
[facility]  ____________________________________________   Event Date/Time   First MD Initiated Contact with Patient 05/27/21 1209     (approximate)  I have reviewed the triage vital signs and the nursing notes.   HISTORY  Chief Complaint Fever, Chills, Generalized Body Aches, and chest congestion    HPI Dominique Avila is a 55 y.o. female with no significant past medical history presents with body aches and cough.  Symptoms started yesterday.  She endorses diffuse body aches which is her primary complaint.  She also has had cough that is productive of clear sputum but significantly relieved by Mucinex.  She denies shortness of breath she does have chest pain but only with coughing.  No exertional symptoms.  Denies fevers, chills abdominal pain nausea vomiting or diarrhea.  No sick contacts.         Past Medical History:  Diagnosis Date   Family history of adverse reaction to anesthesia    MOM-HARD TIME WAKING UP   Hemorrhoids 2018    Patient Active Problem List   Diagnosis Date Noted   Colon cancer screening    Internal and external hemorrhoids without complication 85/46/2703   Grade IV internal hemorrhoids 03/07/2017   Smoking greater than 30 pack years 03/07/2017    Past Surgical History:  Procedure Laterality Date   COLONOSCOPY WITH PROPOFOL N/A 04/02/2017   Procedure: COLONOSCOPY WITH PROPOFOL;  Surgeon: Lin Landsman, MD;  Location: [facility] ENDOSCOPY;  Service: Gastroenterology;  Laterality: N/A;   HEMORRHOID SURGERY N/A 08/31/2018   Procedure: HEMORRHOIDECTOMY;  Surgeon: Olean Ree, MD;  Location: [facility] ORS;  Service: General;  Laterality: N/A;    Prior to Admission medications   Medication Sig Start Date End Date Taking? Authorizing Provider  albuterol (PROVENTIL HFA;VENTOLIN HFA) 108 (90 Base) MCG/ACT inhaler Inhale 2 puffs into the lungs every 6 (six) hours as needed for wheezing or shortness of breath. 10/02/18   Gregor Hams, MD  augmented betamethasone dipropionate (DIPROLENE-AF) 0.05 % ointment Apply topically daily. 10/25/20   McDonald, Stephan Minister, DPM  ciclopirox (PENLAC) 8 % solution Apply topically at bedtime. Apply over nail and surrounding skin. Apply daily over previous coat. After seven (7) days, may remove with alcohol and continue cycle. 09/06/20   McDonald, Stephan Minister, DPM  clotrimazole-betamethasone (LOTRISONE) cream Apply 1 application topically 2 (two) times daily. 09/06/20   McDonald, Stephan Minister, DPM  docusate sodium (COLACE) 100 MG capsule Take 100 mg by mouth daily.    [provider]  hydrocortisone (ANUSOL-HC) 2.5 % rectal cream Place 1 application rectally 2 (two) times daily. Patient taking differently: Place 1 application rectally daily as needed for hemorrhoids.  06/10/18   Lin Landsman, MD  Lidocaine, Anorectal, 5 % CREA Apply 1 applicator topically 3 (three) times daily. 09/16/18   Olean Ree, MD  mupirocin ointment (BACTROBAN) 2 % Apply 1 application topically 2 (two) times daily. 07/31/20   McDonald, Stephan Minister, DPM  sulfamethoxazole-trimethoprim (BACTRIM DS) 800-160 MG tablet Take 1 tablet by mouth 2 (two) times daily. 07/31/20   Criselda Peaches, DPM    Allergies Patient has no known allergies.  Family History  Problem Relation Age of Onset   Diabetes Father    Breast cancer Maternal Aunt     Social History Social History   Tobacco Use   Smoking status: Every Day    Packs/day: 2.00    Years: 40.00    Pack years: 80.00    Types: Cigarettes   Smokeless  tobacco: Never  Vaping Use   Vaping Use: Never used  Substance Use Topics   Alcohol use: No   Drug use: No    Review of Systems   Review of Systems  Constitutional:  Positive for appetite change and fatigue. Negative for chills and fever.  Respiratory:  Positive for cough. Negative for shortness of breath.   Cardiovascular:  Positive for chest pain.  Gastrointestinal:  Negative for abdominal pain, nausea and vomiting.   Genitourinary:  Negative for dysuria.  Musculoskeletal:  Positive for arthralgias and myalgias.  Neurological:  Positive for headaches.  All other systems reviewed and are negative.  Physical Exam Updated Vital Signs BP (!) 156/98 (BP Location: Right Arm)   Pulse 77   Temp 99.6 F (37.6 C) (Oral)   Resp 19   Ht 5\' 9"  (1.753 m)   Wt 83.9 kg   SpO2 96%   BMI 27.32 kg/m   Physical Exam Vitals and nursing note reviewed.  Constitutional:      General: She is not in acute distress.    Appearance: Normal appearance.  HENT:     Head: Normocephalic and atraumatic.     Nose: No congestion.     Mouth/Throat:     Mouth: Mucous membranes are moist.  Eyes:     General: No scleral icterus.    Conjunctiva/sclera: Conjunctivae normal.  Cardiovascular:     Rate and Rhythm: Normal rate and regular rhythm.  Pulmonary:     Effort: Pulmonary effort is normal. No respiratory distress.     Breath sounds: No stridor.  Abdominal:     General: Abdomen is flat. There is no distension.     Palpations: Abdomen is soft.     Tenderness: There is no abdominal tenderness. There is no guarding.  Musculoskeletal:        General: No deformity or signs of injury.     Cervical back: Normal range of motion.  Skin:    General: Skin is warm and dry.     Coloration: Skin is not jaundiced or pale.  Neurological:     General: No focal deficit present.     Mental Status: She is alert and oriented to person, place, and time. Mental status is at baseline.  Psychiatric:        Mood and Affect: Mood normal.        Behavior: Behavior normal.     LABS (all labs ordered are listed, but only abnormal results are displayed)  Labs Reviewed  RESP PANEL BY RT-PCR (FLU A&B, COVID) ARPGX2 - Abnormal; Notable for the following components:      Result Value   Influenza A by PCR POSITIVE (*)    All other components within normal limits   ____________________________________________  EKG  NSR, nml axis, nml  intervals, no acute ischemic changes  ____________________________________________  RADIOLOGY Almeta Monas, personally viewed and evaluated these images (plain radiographs) as part of my medical decision making, as well as reviewing the written report by the radiologist.  ED MD interpretation:  I reviewed the CXR which does not show any acute cardiopulmonary process      ____________________________________________   PROCEDURES  Procedure(s) performed (including Critical Care):  Procedures   ____________________________________________   INITIAL IMPRESSION / ASSESSMENT AND PLAN / ED COURSE     Patient is a 55 year old female presents with diffuse myalgias and cough.  She does have chest pain but only with coughing.  Vital signs within normal limits.  Her main  complaint is really the myalgias.  She is overall well-appearing on exam with clear lungs benign abdomen no lower extremity swelling.  She is notably influenza A positive, which explains her symptoms.  Her EKG is nonischemic and her chest x-ray does not show any focal infiltrate.  Will treat supportively with Tylenol and NSAIDs for myalgias.  We discussed return precautions.      ____________________________________________   FINAL CLINICAL IMPRESSION(S) / ED DIAGNOSES  Final diagnoses:  Influenza A     ED Discharge Orders     None        Note:  This document was prepared using Dragon voice recognition software and may include unintentional dictation errors.    Rada Hay, MD 05/27/21 1320

## 2021-05-27 NOTE — ED Triage Notes (Signed)
Pt c/o fever, chills, body aches, chest congestion, and cough x 2 days.

## 2021-07-24 ENCOUNTER — Other Ambulatory Visit: Payer: Self-pay | Admitting: *Deleted

## 2021-07-24 DIAGNOSIS — F17210 Nicotine dependence, cigarettes, uncomplicated: Secondary | ICD-10-CM

## 2021-08-13 ENCOUNTER — Telehealth (INDEPENDENT_AMBULATORY_CARE_PROVIDER_SITE_OTHER): Payer: Self-pay | Admitting: Acute Care

## 2021-08-13 ENCOUNTER — Other Ambulatory Visit: Payer: Self-pay

## 2021-08-13 ENCOUNTER — Encounter: Payer: Self-pay | Admitting: Acute Care

## 2021-08-13 DIAGNOSIS — F17210 Nicotine dependence, cigarettes, uncomplicated: Secondary | ICD-10-CM

## 2021-08-13 NOTE — Progress Notes (Signed)
Virtual Visit via Video Note  I connected with Pecolia Donati on 08/13/21 at  3:30 PM EST by a video enabled telemedicine application and verified that I am speaking with the correct person using two identifiers.  Location: Patient:  At home Provider:  [place], [place], [place], [address]    I discussed the limitations of evaluation and management by telemedicine and the availability of in person appointments. The patient expressed understanding and agreed to proceed.  Shared Decision Making Visit Lung Cancer Screening Program [phone])   Eligibility: Age 56 y.o. Pack Years Smoking History Calculation 78 pack year smoking hisotry (# packs/per year x # years smoked) Recent History of coughing up blood  no Unexplained weight loss? no ( >Than 15 pounds within the last 6 months ) Prior History Lung / other cancer no (Diagnosis within the last 5 years already requiring surveillance chest CT Scans). Smoking Status Current Smoker Former Smokers: Years since quit:  NA  Quit Date:  NA  Visit Components: Discussion included one or more decision making aids. yes Discussion included risk/benefits of screening. yes Discussion included potential follow up diagnostic testing for abnormal scans. yes Discussion included meaning and risk of over diagnosis. yes Discussion included meaning and risk of False Positives. yes Discussion included meaning of total radiation exposure. yes  Counseling Included: Importance of adherence to annual lung cancer LDCT screening. yes Impact of comorbidities on ability to participate in the program. yes Ability and willingness to under diagnostic treatment. yes  Smoking Cessation Counseling: Current Smokers:  Discussed importance of smoking cessation. yes Information about tobacco cessation classes and interventions provided to patient. yes Patient provided with "ticket" for LDCT Scan. yes Symptomatic Patient. no  Counseling NA Diagnosis Code:  Tobacco Use Z72.0 Asymptomatic Patient yes  Counseling (Intermediate counseling: > three minutes counseling) Q7591 Former Smokers:  Discussed the importance of maintaining cigarette abstinence. yes Diagnosis Code: Personal History of Nicotine Dependence. M38.466 Information about tobacco cessation classes and interventions provided to patient. Yes Patient provided with "ticket" for LDCT Scan. yes Written Order for Lung Cancer Screening with LDCT placed in Epic. Yes (CT Chest Lung Cancer Screening Low Dose W/O CM) [id number] Z12.2-Screening of respiratory organs Z87.891-Personal history of nicotine dependence  I have spent 25 minutes of face to face/ virtual visit   time with  Ms. Beissel discussing the risks and benefits of lung cancer screening. We viewed / discussed a power point together that explained in detail the above noted topics. We paused at intervals to allow for questions to be asked and answered to ensure understanding.We discussed that the single most powerful action that she can take to decrease her risk of developing lung cancer is to quit smoking. We discussed whether or not she is ready to commit to setting a quit date. We discussed options for tools to aid in quitting smoking including nicotine replacement therapy, non-nicotine medications, support groups, Quit Smart classes, and behavior modification. We discussed that often times setting smaller, more achievable goals, such as eliminating 1 cigarette a day for a week and then 2 cigarettes a day for a week can be helpful in slowly decreasing the number of cigarettes smoked. This allows for a sense of accomplishment as well as providing a clinical benefit. I provided  her  with smoking cessation  information  with contact information for community resources, classes, free nicotine replacement therapy, and access to mobile apps, text messaging, and on-line smoking cessation help. I have also provided  her  the office contact information  in the event she needs to contact me, or the screening staff. We discussed the time and location of the scan, and that either Doroteo Glassman RN, Joella Prince, RN  or I will call / send a letter with the results within 24-72 hours of receiving them. The patient verbalized understanding of all of  the above and had no further questions upon leaving the office. They have my contact information in the event they have any further questions.  I spent 4 minutes counseling on smoking cessation and the health risks of continued tobacco abuse.  I explained to the patient that there has been a high incidence of coronary artery disease noted on these exams. I explained that this is a non-gated exam therefore degree or severity cannot be determined. This patient is not on statin therapy. I have asked the patient to follow-up with their PCP regarding any incidental finding of coronary artery disease and management with diet or medication as their PCP  feels is clinically indicated. The patient verbalized understanding of the above and had no further questions upon completion of the visit.      Magdalen Spatz, NP 08/13/2021

## 2021-08-14 ENCOUNTER — Ambulatory Visit
Admission: RE | Admit: 2021-08-14 | Discharge: 2021-08-14 | Disposition: A | Discharge status: Home / Self Care | Payer: 59 | Source: Ambulatory Visit | Attending: Acute Care | Admitting: Acute Care

## 2021-08-14 ENCOUNTER — Other Ambulatory Visit: Payer: Self-pay

## 2021-08-14 DIAGNOSIS — F17210 Nicotine dependence, cigarettes, uncomplicated: Secondary | ICD-10-CM | POA: Diagnosis present

## 2021-08-16 ENCOUNTER — Other Ambulatory Visit: Payer: Self-pay | Admitting: Acute Care

## 2021-08-16 DIAGNOSIS — F17200 Nicotine dependence, unspecified, uncomplicated: Secondary | ICD-10-CM

## 2021-08-16 DIAGNOSIS — Z87891 Personal history of nicotine dependence: Secondary | ICD-10-CM

## 2021-08-24 LAB — HM HEPATITIS C SCREENING LAB: HM Hepatitis Screen: NEGATIVE

## 2021-09-11 ENCOUNTER — Other Ambulatory Visit: Payer: Self-pay | Admitting: Infectious Diseases

## 2021-09-11 DIAGNOSIS — Z1231 Encounter for screening mammogram for malignant neoplasm of breast: Secondary | ICD-10-CM

## 2021-10-18 ENCOUNTER — Ambulatory Visit
Admission: RE | Admit: 2021-10-18 | Discharge: 2021-10-18 | Disposition: A | Discharge status: Home / Self Care | Payer: 59 | Source: Ambulatory Visit | Attending: Infectious Diseases | Admitting: Infectious Diseases

## 2021-10-18 DIAGNOSIS — Z1231 Encounter for screening mammogram for malignant neoplasm of breast: Secondary | ICD-10-CM | POA: Insufficient documentation

## 2022-08-14 ENCOUNTER — Ambulatory Visit: Payer: 59

## 2022-08-16 ENCOUNTER — Ambulatory Visit: Admission: RE | Admit: 2022-08-16 | Payer: 59 | Source: Ambulatory Visit

## 2022-08-19 ENCOUNTER — Telehealth: Payer: Self-pay | Admitting: *Deleted

## 2022-08-19 NOTE — Telephone Encounter (Signed)
Left message for pt to call back to reschedule lung cancer screening CT.

## 2022-09-16 ENCOUNTER — Encounter: Payer: Self-pay | Admitting: Infectious Diseases

## 2022-10-18 ENCOUNTER — Encounter: Payer: Self-pay | Admitting: Nurse Practitioner

## 2022-10-18 ENCOUNTER — Other Ambulatory Visit: Payer: Self-pay

## 2022-10-18 ENCOUNTER — Ambulatory Visit (INDEPENDENT_AMBULATORY_CARE_PROVIDER_SITE_OTHER): Payer: 59 | Admitting: Nurse Practitioner

## 2022-10-18 VITALS — BP 130/82 | HR 87 | Temp 97.9°F | Resp 16 | Ht 68.0 in | Wt 200.2 lb

## 2022-10-18 DIAGNOSIS — Z23 Encounter for immunization: Secondary | ICD-10-CM | POA: Diagnosis not present

## 2022-10-18 DIAGNOSIS — E039 Hypothyroidism, unspecified: Secondary | ICD-10-CM

## 2022-10-18 DIAGNOSIS — Z130 Encounter for screening for diseases of the blood and blood-forming organs and certain disorders involving the immune mechanism: Secondary | ICD-10-CM

## 2022-10-18 DIAGNOSIS — Z7689 Persons encountering health services in other specified circumstances: Secondary | ICD-10-CM

## 2022-10-18 DIAGNOSIS — J449 Chronic obstructive pulmonary disease, unspecified: Secondary | ICD-10-CM

## 2022-10-18 DIAGNOSIS — B078 Other viral warts: Secondary | ICD-10-CM | POA: Diagnosis not present

## 2022-10-18 DIAGNOSIS — Z1211 Encounter for screening for malignant neoplasm of colon: Secondary | ICD-10-CM

## 2022-10-18 DIAGNOSIS — Z114 Encounter for screening for human immunodeficiency virus [HIV]: Secondary | ICD-10-CM | POA: Diagnosis not present

## 2022-10-18 DIAGNOSIS — R1901 Right upper quadrant abdominal swelling, mass and lump: Secondary | ICD-10-CM | POA: Diagnosis not present

## 2022-10-18 DIAGNOSIS — F17210 Nicotine dependence, cigarettes, uncomplicated: Secondary | ICD-10-CM

## 2022-10-18 DIAGNOSIS — Z131 Encounter for screening for diabetes mellitus: Secondary | ICD-10-CM | POA: Diagnosis not present

## 2022-10-18 DIAGNOSIS — Z13220 Encounter for screening for lipoid disorders: Secondary | ICD-10-CM | POA: Diagnosis not present

## 2022-10-18 DIAGNOSIS — Z1231 Encounter for screening mammogram for malignant neoplasm of breast: Secondary | ICD-10-CM

## 2022-10-18 DIAGNOSIS — N644 Mastodynia: Secondary | ICD-10-CM

## 2022-10-18 MED ORDER — ALBUTEROL SULFATE HFA 108 (90 BASE) MCG/ACT IN AERS: 2.0000 | INHALATION_SPRAY | Freq: Four times a day (QID) | RESPIRATORY_TRACT | 2 refills | Status: DC | PRN

## 2022-10-18 NOTE — Progress Notes (Signed)
BP 130/82   Pulse 87   Temp 97.9 F (36.6 C) (Oral)   Resp 16   Ht 5\' 8"  (1.727 m)   Wt 200 lb 3.2 oz (90.8 kg)   SpO2 98%   BMI 30.44 kg/m    Subjective:    Patient ID: Dominique Avila, female    DOB: [birth year], 57 y.o.   MRN: [id number]  HPI: Dominique Avila is a 57 y.o. female  Chief Complaint  Patient presents with   Establish Care   COPD   Hypothyroidism   Breast Pain    Left breast   Establish care: her last physical was last year.  Medical history includes hypothyroidism, COPD, current smoker.  Family history includes brain and throat cancer, DM, leaky heart valve, breast cancer.  Health maintenance due for labs, colonoscopy, mammogram.   Hypothyroidism: she is currently taking levothyroxine 50 mcg daily.  Her last TSH was 7.897 and T 4 was 0.63. will get labs today.   Smoker/COPD: she currently smokes 1 1/2-2 ppd.  She is due for her lung cancer screening.  Order placed.  She currently uses albuterol for her COPD.   She uses her albuterol once a day before work. She says that is working well for her.    Breast pain: left breast pain for awhile, she says the pain has increased this week.  No nipple drainage.   Will get ultrasound and diagnostic mammogram.    Mass in right upper abdomen.  She says that she will have a mass just bulge out of her right upper quadrant.  She says that she has had this for awhile.  Will order ct to evaluate mass.   Warts on hand: she has two warts on both hands, she would like to have them removed. She says they have been there for years.  She would like to go to dermatology to have them removed. Referral placed.   Relevant past medical, surgical, family and social history reviewed and updated as indicated. Interim medical history since our last visit reviewed. Allergies and medications reviewed and updated.  Review of Systems  Constitutional: Negative for fever or weight change.  Respiratory: Negative for cough and shortness of breath.    Cardiovascular: Negative for chest pain or palpitations.  Gastrointestinal: Negative for abdominal pain, no bowel changes.  Musculoskeletal: Negative for gait problem or joint swelling.  Skin: Negative for rash.  Neurological: Negative for dizziness or headache.  No other specific complaints in a complete review of systems (except as listed in HPI above).      Objective:    BP 130/82   Pulse 87   Temp 97.9 F (36.6 C) (Oral)   Resp 16   Ht 5\' 8"  (1.727 m)   Wt 200 lb 3.2 oz (90.8 kg)   SpO2 98%   BMI 30.44 kg/m   Wt Readings from Last 3 Encounters:  10/18/22 200 lb 3.2 oz (90.8 kg)  08/14/21 190 lb (86.2 kg)  05/27/21 185 lb (83.9 kg)    Physical Exam  Constitutional: Patient appears well-developed and well-nourished. Obese  No distress.  HEENT: head atraumatic, normocephalic, pupils equal and reactive to light, neck supple, throat within normal limits Cardiovascular: Normal rate, regular rhythm and normal heart sounds.  No murmur heard. No BLE edema. Breasts: breasts appear normal, no suspicious masses, no skin or nipple changes or axillary nodes.  Pulmonary/Chest: Effort normal and breath sounds normal. No respiratory distress. Abdominal: Soft.  There is no tenderness. Psychiatric: Patient  has a normal mood and affect. behavior is normal. Judgment and thought content normal.  Results for orders placed or performed in visit on 10/18/22  HM HEPATITIS C SCREENING LAB  Result Value Ref Range   HM Hepatitis Screen Negative - Patient Reported       Assessment & Plan:   Problem List Items Addressed This Visit       Respiratory   Chronic obstructive pulmonary disease ([place])    Continue using albuterol as needed for copd.  Work on reducing cigarette use       Relevant Medications   albuterol (VENTOLIN HFA) 108 (90 Base) MCG/ACT inhaler   Other Relevant Orders   Ambulatory Referral Lung Cancer Screening [facility]     Endocrine   Hypothyroidism - Primary     Continue with current treatment, levothyroxine 50 mcg daily,  getting labs will adjust if needed      Relevant Medications   levothyroxine (SYNTHROID) 50 MCG tablet   Other Relevant Orders   Thyroid Panel With TSH     Other   Smoking greater than 30 pack years    Due for lung cancer screening, order placed      Relevant Orders   Ambulatory Referral Lung Cancer Screening [facility]   Colon cancer screening    Referral placed      Relevant Orders   Ambulatory referral to Gastroenterology   Other Visit Diagnoses     Need for Tdap vaccination       Relevant Orders   Tdap vaccine greater than or equal to 7yo IM (Completed)   Screening for HIV without presence of risk factors       Relevant Orders   HIV Antibody (routine testing w rflx)   Screening for deficiency anemia       Relevant Orders   CBC with Differential/Platelet   Screening for diabetes mellitus       Relevant Orders   COMPLETE METABOLIC PANEL WITH GFR   Hemoglobin A1c   Screening for cholesterol level       Relevant Orders   Lipid panel   Abdominal mass, right upper quadrant       ct scan ordered for eval for possible hernia?   Relevant Orders   CT Abdomen Pelvis Wo Contrast   Other viral warts       referral placed to dermatology.   Relevant Orders   Ambulatory referral to Dermatology   Encounter for screening mammogram for malignant neoplasm of breast       screening mammogram ordered for right breast   Relevant Orders   MM 3D SCREENING MAMMOGRAM UNILATERAL RIGHT BREAST   Breast pain       ordered diagnostic mammogram and ultrasound of left breast   Relevant Orders   MM 3D DIAGNOSTIC MAMMOGRAM UNILATERAL LEFT BREAST   US LIMITED ULTRASOUND INCLUDING AXILLA LEFT BREAST    Encounter to establish care       schedule cpe when due        Follow up plan: Return in about 4 months (around 02/17/2023) for follow up.

## 2022-10-18 NOTE — Assessment & Plan Note (Signed)
Continue with current treatment, levothyroxine 50 mcg daily,  getting labs will adjust if needed

## 2022-10-18 NOTE — Assessment & Plan Note (Signed)
Referral placed.

## 2022-10-18 NOTE — Assessment & Plan Note (Signed)
Continue using albuterol as needed for copd.  Work on reducing cigarette use

## 2022-10-18 NOTE — Assessment & Plan Note (Signed)
Due for lung cancer screening, order placed

## 2022-10-21 ENCOUNTER — Other Ambulatory Visit: Payer: Self-pay | Admitting: Nurse Practitioner

## 2022-10-21 DIAGNOSIS — E039 Hypothyroidism, unspecified: Secondary | ICD-10-CM

## 2022-10-21 LAB — COMPLETE METABOLIC PANEL WITH GFR
AG Ratio: 1.7 (calc) (ref 1.0–2.5)
ALT: 13 U/L (ref 6–29)
AST: 20 U/L (ref 10–35)
Albumin: 4.3 g/dL (ref 3.6–5.1)
Alkaline phosphatase (APISO): 58 U/L (ref 37–153)
BUN: 17 mg/dL (ref 7–25)
CO2: 26 mmol/L (ref 20–32)
Calcium: 9.3 mg/dL (ref 8.6–10.4)
Chloride: 106 mmol/L (ref 98–110)
Creat: 0.73 mg/dL (ref 0.50–1.03)
Globulin: 2.5 g/dL (calc) (ref 1.9–3.7)
Glucose, Bld: 93 mg/dL (ref 65–99)
Potassium: 3.6 mmol/L (ref 3.5–5.3)
Sodium: 141 mmol/L (ref 135–146)
Total Bilirubin: 0.7 mg/dL (ref 0.2–1.2)
Total Protein: 6.8 g/dL (ref 6.1–8.1)
eGFR: 96 mL/min/{1.73_m2} (ref >=60)

## 2022-10-21 LAB — THYROID PANEL WITH TSH
Free Thyroxine Index: 2.1 (ref 1.4–3.8)
T3 Uptake: 26 % (ref 22–35)
T4, Total: 8.1 ug/dL (ref 5.1–11.9)
TSH: 4.63 mIU/L — ABNORMAL HIGH (ref 0.40–4.50)

## 2022-10-21 LAB — CBC WITH DIFFERENTIAL/PLATELET
Absolute Monocytes: 330 cells/uL (ref 200–950)
Basophils Absolute: 22 cells/uL (ref 0–200)
Basophils Relative: 0.4 %
Eosinophils Absolute: 90 cells/uL (ref 15–500)
Eosinophils Relative: 1.6 %
HCT: 44.9 % (ref 35.0–45.0)
Hemoglobin: 15.4 g/dL (ref 11.7–15.5)
Lymphs Abs: 2621 cells/uL (ref 850–3900)
MCH: 30.4 pg (ref 27.0–33.0)
MCHC: 34.3 g/dL (ref 32.0–36.0)
MCV: 88.6 fL (ref 80.0–100.0)
MPV: 10.8 fL (ref 7.5–12.5)
Monocytes Relative: 5.9 %
Neutro Abs: 2537 cells/uL (ref 1500–7800)
Neutrophils Relative %: 45.3 %
Platelets: 270 10*3/uL (ref 140–400)
RBC: 5.07 10*6/uL (ref 3.80–5.10)
RDW: 12.9 % (ref 11.0–15.0)
Total Lymphocyte: 46.8 %
WBC: 5.6 10*3/uL (ref 3.8–10.8)

## 2022-10-21 LAB — LIPID PANEL
Cholesterol: 227 mg/dL — ABNORMAL HIGH (ref <200)
HDL: 59 mg/dL (ref >=50)
LDL Cholesterol (Calc): 145 mg/dL (calc) — ABNORMAL HIGH
Non-HDL Cholesterol (Calc): 168 mg/dL (calc) — ABNORMAL HIGH (ref <130)
Total CHOL/HDL Ratio: 3.8 (calc) (ref <5.0)
Triglycerides: 109 mg/dL (ref <150)

## 2022-10-21 LAB — HEMOGLOBIN A1C
Hgb A1c MFr Bld: 5.9 % of total Hgb — ABNORMAL HIGH (ref <5.7)
Mean Plasma Glucose: 123 mg/dL
eAG (mmol/L): 6.8 mmol/L

## 2022-10-21 LAB — HIV ANTIBODY (ROUTINE TESTING W REFLEX): HIV 1&2 Ab, 4th Generation: NONREACTIVE

## 2022-10-21 MED ORDER — LEVOTHYROXINE SODIUM 75 MCG PO TABS: 75.0000 ug | ORAL_TABLET | Freq: Every day | ORAL | 0 refills | Status: DC

## 2022-10-28 ENCOUNTER — Encounter: Payer: Self-pay | Admitting: *Deleted

## 2022-11-01 ENCOUNTER — Telehealth: Payer: Self-pay

## 2022-11-01 ENCOUNTER — Ambulatory Visit
Admission: RE | Admit: 2022-11-01 | Discharge: 2022-11-01 | Disposition: A | Discharge status: Home / Self Care | Payer: 59 | Source: Ambulatory Visit | Attending: Nurse Practitioner | Admitting: Nurse Practitioner

## 2022-11-01 DIAGNOSIS — N644 Mastodynia: Secondary | ICD-10-CM

## 2022-11-01 DIAGNOSIS — Z86010 Personal history of colonic polyps: Secondary | ICD-10-CM

## 2022-11-01 DIAGNOSIS — R922 Inconclusive mammogram: Secondary | ICD-10-CM | POA: Diagnosis not present

## 2022-11-01 NOTE — Telephone Encounter (Signed)
Pt left message to schedule colonoscopy please return call  

## 2022-11-04 ENCOUNTER — Other Ambulatory Visit: Payer: Self-pay

## 2022-11-04 DIAGNOSIS — Z860100 Personal history of colon polyps, unspecified: Secondary | ICD-10-CM

## 2022-11-04 MED ORDER — NA SULFATE-K SULFATE-MG SULF 17.5-3.13-1.6 GM/177ML PO SOLN: 1.0000 | Freq: Once | ORAL | 0 refills | Status: AC

## 2022-11-04 NOTE — Telephone Encounter (Signed)
Gastroenterology Pre-Procedure Review  Request Date: 12/06/22 Requesting Physician: Dr. Allegra Lai  PATIENT REVIEW QUESTIONS: The patient responded to the following health history questions as indicated:    1. Are you having any GI issues? no 2. Do you have a personal history of Polyps? yes (last colonoscopy performed by Dr. Allegra Lai 04/02/2017) 3. Do you have a family history of Colon Cancer or Polyps? yes (mother, grandfather, grandmother) 4. Diabetes Mellitus? no 5. Joint replacements in the past 12 months?no 6. Major health problems in the past 3 months?no 7. Any artificial heart valves, MVP, or defibrillator?no    MEDICATIONS & ALLERGIES:    Patient reports the following regarding taking any anticoagulation/antiplatelet therapy:   Plavix, Coumadin, Eliquis, Xarelto, Lovenox, Pradaxa, Brilinta, or Effient? no Aspirin? no  Patient confirms/reports the following medications:  Current Outpatient Medications  Medication Sig Dispense Refill   albuterol (VENTOLIN HFA) 108 (90 Base) MCG/ACT inhaler Inhale 2 puffs into the lungs every 6 (six) hours as needed for wheezing or shortness of breath. 1 each 2   levothyroxine (SYNTHROID) 75 MCG tablet Take 1 tablet (75 mcg total) by mouth daily. 90 tablet 0   No current facility-administered medications for this visit.    Patient confirms/reports the following allergies:  No Known Allergies  No orders of the defined types were placed in this encounter.   AUTHORIZATION INFORMATION Primary Insurance: 1D#: Group #:  Secondary Insurance: 1D#: Group #:  SCHEDULE INFORMATION: Date: 12/06/22 Time: Location: [facility]

## 2022-11-04 NOTE — Addendum Note (Signed)
Addended by: Avie Arenas on: 11/04/2022 08:51 AM   Modules accepted: Orders

## 2022-11-18 ENCOUNTER — Telehealth: Payer: Self-pay

## 2022-11-18 NOTE — Telephone Encounter (Signed)
Pt would like to cancel procedure and reschedule please return call

## 2022-11-19 NOTE — Telephone Encounter (Signed)
Pt call returned.  Colonoscopy has been rescheduled to 12/27/22.    Trish in Endo has been notified.  Thanks, [place], [place]

## 2022-11-29 ENCOUNTER — Ambulatory Visit
Admission: RE | Admit: 2022-11-29 | Discharge: 2022-11-29 | Disposition: A | Discharge status: Home / Self Care | Payer: 59 | Source: Ambulatory Visit | Attending: Nurse Practitioner | Admitting: Nurse Practitioner

## 2022-11-29 DIAGNOSIS — E039 Hypothyroidism, unspecified: Secondary | ICD-10-CM | POA: Diagnosis not present

## 2022-11-29 DIAGNOSIS — I700 Atherosclerosis of aorta: Secondary | ICD-10-CM | POA: Diagnosis not present

## 2022-11-29 DIAGNOSIS — K5730 Diverticulosis of large intestine without perforation or abscess without bleeding: Secondary | ICD-10-CM | POA: Diagnosis not present

## 2022-11-29 DIAGNOSIS — R1901 Right upper quadrant abdominal swelling, mass and lump: Secondary | ICD-10-CM

## 2022-11-30 LAB — TSH: TSH: 2.52 mIU/L (ref 0.40–4.50)

## 2022-12-02 ENCOUNTER — Other Ambulatory Visit: Payer: Self-pay | Admitting: Nurse Practitioner

## 2022-12-02 DIAGNOSIS — K429 Umbilical hernia without obstruction or gangrene: Secondary | ICD-10-CM

## 2022-12-20 ENCOUNTER — Telehealth: Payer: Self-pay

## 2022-12-20 NOTE — Telephone Encounter (Signed)
Patients colonoscopy has been canceled for 12/27/22 due to hernia surgery.  She will call back sometime in July to reschedule her colonoscopy with Dr. Allegra Lai.  Trish in Endo has been notified of procedure cancellation.  Thanks, [place], [place]

## 2022-12-20 NOTE — Telephone Encounter (Signed)
Patient left a message on voicemail 12/19/2022 at 3:50 that she needed to cancel colonoscopy that I scheduled for 12/27/22. She would like a call back. Did not say if she wanted to rescheduled

## 2022-12-27 ENCOUNTER — Ambulatory Visit: Admit: 2022-12-27 | Payer: 59 | Admitting: Gastroenterology

## 2022-12-27 SURGERY — COLONOSCOPY WITH PROPOFOL
Anesthesia: General

## 2023-01-10 ENCOUNTER — Other Ambulatory Visit: Payer: Self-pay | Admitting: Nurse Practitioner

## 2023-01-10 DIAGNOSIS — J449 Chronic obstructive pulmonary disease, unspecified: Secondary | ICD-10-CM

## 2023-01-10 NOTE — Telephone Encounter (Signed)
Requested Prescriptions  Pending Prescriptions Disp Refills   albuterol (VENTOLIN HFA) 108 (90 Base) MCG/ACT inhaler [Pharmacy Med Name: Albuterol Sulfate HFA 108 (90 Base) MCG/ACT Inhalation Aerosol Solution] 18 g 0    Sig: INHALE 2 PUFFS BY MOUTH EVERY 6 HOURS AS NEEDED FOR WHEEZING FOR SHORTNESS OF BREATH     Pulmonology:  Beta Agonists 2 Passed - 01/10/2023  6:51 AM      Passed - Last BP in normal range    BP Readings from Last 1 Encounters:  10/18/22 130/82         Passed - Last Heart Rate in normal range    Pulse Readings from Last 1 Encounters:  10/18/22 87         Passed - Valid encounter within last 12 months    Recent Outpatient Visits           2 months ago Hypothyroidism, unspecified type   [facility] Berniece Salines, FNP       Future Appointments             In 1 month Deirdre Evener, MD [facility]   In 1 month Zane Herald, Rudolpho Sevin, FNP [facility]

## 2023-01-14 ENCOUNTER — Other Ambulatory Visit: Payer: Self-pay | Admitting: Nurse Practitioner

## 2023-01-14 DIAGNOSIS — E039 Hypothyroidism, unspecified: Secondary | ICD-10-CM

## 2023-01-14 NOTE — Telephone Encounter (Signed)
Medication Refill - Medication: levothyroxine (SYNTHROID) 75 MCG tablet   Has the patient contacted their pharmacy? Yes.   (Agent: If no, request that the patient contact the pharmacy for the refill. If patient does not wish to contact the pharmacy document the reason why and proceed with request.) (Agent: If yes, when and what did the pharmacy advise?)  Preferred Pharmacy (with phone number or street name):  [facility] [address] ([address]), [place] - [address]  [address]) [place] [address]  Phone: [phone] Fax: [phone]   Has the patient been seen for an appointment in the last year OR does the patient have an upcoming appointment? Yes.    Agent: Please be advised that RX refills may take up to 3 business days. We ask that you follow-up with your pharmacy.

## 2023-01-15 MED ORDER — LEVOTHYROXINE SODIUM 75 MCG PO TABS
75.0000 ug | ORAL_TABLET | Freq: Every day | ORAL | 0 refills | Status: DC
Start: 2023-01-15 — End: 2023-04-14

## 2023-01-15 NOTE — Telephone Encounter (Signed)
Requested Prescriptions  Pending Prescriptions Disp Refills   levothyroxine (SYNTHROID) 75 MCG tablet 90 tablet 0    Sig: Take 1 tablet (75 mcg total) by mouth daily.     Endocrinology:  Hypothyroid Agents Passed - 01/14/2023 10:55 AM      Passed - TSH in normal range and within 360 days    TSH  Date Value Ref Range Status  11/29/2022 2.52 0.40 - 4.50 mIU/L Final         Passed - Valid encounter within last 12 months    Recent Outpatient Visits           2 months ago Hypothyroidism, unspecified type   [facility] Berniece Salines, FNP       Future Appointments             In 4 weeks Deirdre Evener, MD [facility]   In 1 month Zane Herald, Rudolpho Sevin, FNP [facility], [facility]

## 2023-01-22 ENCOUNTER — Encounter: Payer: Self-pay | Admitting: *Deleted

## 2023-01-22 DIAGNOSIS — Z8249 Family history of ischemic heart disease and other diseases of the circulatory system: Secondary | ICD-10-CM | POA: Diagnosis not present

## 2023-01-22 DIAGNOSIS — Z79899 Other long term (current) drug therapy: Secondary | ICD-10-CM | POA: Diagnosis not present

## 2023-01-22 DIAGNOSIS — Z818 Family history of other mental and behavioral disorders: Secondary | ICD-10-CM | POA: Diagnosis not present

## 2023-01-22 DIAGNOSIS — J301 Allergic rhinitis due to pollen: Secondary | ICD-10-CM | POA: Diagnosis not present

## 2023-01-22 DIAGNOSIS — J449 Chronic obstructive pulmonary disease, unspecified: Secondary | ICD-10-CM | POA: Diagnosis not present

## 2023-01-22 DIAGNOSIS — Z833 Family history of diabetes mellitus: Secondary | ICD-10-CM | POA: Diagnosis not present

## 2023-01-22 DIAGNOSIS — N393 Stress incontinence (female) (male): Secondary | ICD-10-CM | POA: Diagnosis not present

## 2023-01-22 DIAGNOSIS — Z809 Family history of malignant neoplasm, unspecified: Secondary | ICD-10-CM | POA: Diagnosis not present

## 2023-01-22 DIAGNOSIS — F17210 Nicotine dependence, cigarettes, uncomplicated: Secondary | ICD-10-CM | POA: Diagnosis not present

## 2023-01-22 DIAGNOSIS — I10 Essential (primary) hypertension: Secondary | ICD-10-CM | POA: Diagnosis not present

## 2023-01-22 DIAGNOSIS — E039 Hypothyroidism, unspecified: Secondary | ICD-10-CM | POA: Diagnosis not present

## 2023-02-08 ENCOUNTER — Other Ambulatory Visit: Payer: Self-pay | Admitting: Nurse Practitioner

## 2023-02-08 DIAGNOSIS — J449 Chronic obstructive pulmonary disease, unspecified: Secondary | ICD-10-CM

## 2023-02-10 NOTE — Telephone Encounter (Signed)
Requested Prescriptions  Pending Prescriptions Disp Refills   albuterol (VENTOLIN HFA) 108 (90 Base) MCG/ACT inhaler [Pharmacy Med Name: Albuterol Sulfate HFA 108 (90 Base) MCG/ACT Inhalation Aerosol Solution] 18 g 0    Sig: INHALE 2 PUFFS BY MOUTH EVERY 6 HOURS AS NEEDED FOR WHEEZING OR  SHORTNESS  OF  BREATH     Pulmonology:  Beta Agonists 2 Passed - 02/08/2023  9:16 AM      Passed - Last BP in normal range    BP Readings from Last 1 Encounters:  10/18/22 130/82         Passed - Last Heart Rate in normal range    Pulse Readings from Last 1 Encounters:  10/18/22 87         Passed - Valid encounter within last 12 months    Recent Outpatient Visits           3 months ago Hypothyroidism, unspecified type   [facility] Berniece Salines, FNP       Future Appointments             In 1 week Zane Herald, Rudolpho Sevin, FNP [facility], [facility]   In 1 month Elie Goody, MD [facility]

## 2023-02-13 ENCOUNTER — Ambulatory Visit: Payer: 59 | Admitting: Dermatology

## 2023-02-21 ENCOUNTER — Ambulatory Visit (INDEPENDENT_AMBULATORY_CARE_PROVIDER_SITE_OTHER): Payer: 59 | Admitting: Nurse Practitioner

## 2023-02-21 ENCOUNTER — Encounter: Payer: Self-pay | Admitting: Nurse Practitioner

## 2023-02-21 ENCOUNTER — Other Ambulatory Visit: Payer: Self-pay

## 2023-02-21 VITALS — BP 118/72 | HR 87 | Temp 98.1°F | Resp 16 | Ht 68.0 in | Wt 189.1 lb

## 2023-02-21 DIAGNOSIS — Z122 Encounter for screening for malignant neoplasm of respiratory organs: Secondary | ICD-10-CM

## 2023-02-21 DIAGNOSIS — Z1211 Encounter for screening for malignant neoplasm of colon: Secondary | ICD-10-CM

## 2023-02-21 DIAGNOSIS — R7303 Prediabetes: Secondary | ICD-10-CM

## 2023-02-21 DIAGNOSIS — E039 Hypothyroidism, unspecified: Secondary | ICD-10-CM | POA: Diagnosis not present

## 2023-02-21 DIAGNOSIS — Z23 Encounter for immunization: Secondary | ICD-10-CM

## 2023-02-21 DIAGNOSIS — J449 Chronic obstructive pulmonary disease, unspecified: Secondary | ICD-10-CM | POA: Diagnosis not present

## 2023-02-21 DIAGNOSIS — F17210 Nicotine dependence, cigarettes, uncomplicated: Secondary | ICD-10-CM | POA: Diagnosis not present

## 2023-02-21 NOTE — Assessment & Plan Note (Signed)
Continue using albuterol as needed for copd, work on reducing cigarette usage

## 2023-02-21 NOTE — Assessment & Plan Note (Signed)
Continue levothyroxine 75 mcg daily, checking labs today

## 2023-02-21 NOTE — Progress Notes (Signed)
BP 118/72   Pulse 87   Temp 98.1 F (36.7 C) (Oral)   Resp 16   Ht 5\' 8"  (1.727 m)   Wt 189 lb 1.6 oz (85.8 kg)   SpO2 95%   BMI 28.75 kg/m    Subjective:    Patient ID: Dominique Avila, female    DOB: 06-May-1966, 57 y.o.   MRN: [id number]  HPI: Dominique Avila is a 57 y.o. female  Chief Complaint  Patient presents with   Hypothyroidism   COPD    Hypothyroidism: she is currently taking levothyroxine 75 mcg daily. Last TSH was in normal range. Patient denies any fatigue, palpitations, or constipation. Will recheck today.   COPD/current smoker: -smokes 1 1/2-2 packs a day. She is due for lung cancer screening, order placed -COPD status: stable -Current medications: albuterol -Satisfied with current treatment?: yes -Oxygen use: no -Dyspnea frequency: none -Cough frequency: daily -Rescue inhaler frequency:  daily -Limitation of activity: no -Productive cough: none -Last Spirometry/PFTs: none -Pneumovax: Not up to Date -Influenza: Not up to Date    Relevant past medical, surgical, family and social history reviewed and updated as indicated. Interim medical history since our last visit reviewed. Allergies and medications reviewed and updated.  Review of Systems  Constitutional: Negative for fever or weight change.  Respiratory: Negative for cough and shortness of breath.   Cardiovascular: Negative for chest pain or palpitations.  Gastrointestinal: Negative for abdominal pain, no bowel changes.  Musculoskeletal: Negative for gait problem or joint swelling.  Skin: Negative for rash.  Neurological: Negative for dizziness or headache.  No other specific complaints in a complete review of systems (except as listed in HPI above).      Objective:    BP 118/72   Pulse 87   Temp 98.1 F (36.7 C) (Oral)   Resp 16   Ht 5\' 8"  (1.727 m)   Wt 189 lb 1.6 oz (85.8 kg)   SpO2 95%   BMI 28.75 kg/m   Wt Readings from Last 3 Encounters:  02/21/23 189 lb 1.6 oz (85.8 kg)   10/18/22 200 lb 3.2 oz (90.8 kg)  08/14/21 190 lb (86.2 kg)    Physical Exam  Constitutional: Patient appears well-developed and well-nourished. Obese  No distress.  HEENT: head atraumatic, normocephalic, pupils equal and reactive to light, neck supple, throat within normal limits Cardiovascular: Normal rate, regular rhythm and normal heart sounds.  No murmur heard. No BLE edema. Breasts: breasts appear normal, no suspicious masses, no skin or nipple changes or axillary nodes.  Pulmonary/Chest: Effort normal and breath sounds normal. No respiratory distress. Abdominal: Soft.  There is no tenderness. Psychiatric: Patient has a normal mood and affect. behavior is normal. Judgment and thought content normal.  Results for orders placed or performed in visit on 10/21/22  TSH  Result Value Ref Range   TSH 2.52 0.40 - 4.50 mIU/L      Assessment & Plan:   Problem List Items Addressed This Visit       Respiratory   Chronic obstructive pulmonary disease (HCC)    Continue using albuterol as needed for copd, work on reducing cigarette usage        Endocrine   Hypothyroidism - Primary    Continue levothyroxine 75 mcg daily, checking labs today      Relevant Orders   TSH     Other   Smoking greater than 30 pack years   Relevant Orders   Ambulatory Referral Lung Cancer Screening [facility]  Colon cancer screening   Relevant Orders   Cologuard   Other Visit Diagnoses     Encounter for screening for lung cancer       Relevant Orders   Ambulatory Referral Lung Cancer Screening [facility]   Need for pneumococcal vaccination       Relevant Orders   Pneumococcal conjugate vaccine 20-valent (Prevnar 20)   Prediabetes       rechecking labs   Relevant Orders   Hemoglobin A1c         Follow up plan: Return in about 6 months (around 08/24/2023) for follow up.

## 2023-03-21 LAB — COLOGUARD: COLOGUARD: NEGATIVE

## 2023-04-09 ENCOUNTER — Ambulatory Visit: Payer: 59 | Admitting: Dermatology

## 2023-04-14 ENCOUNTER — Other Ambulatory Visit: Payer: Self-pay | Admitting: Nurse Practitioner

## 2023-04-14 DIAGNOSIS — E039 Hypothyroidism, unspecified: Secondary | ICD-10-CM

## 2023-04-14 DIAGNOSIS — J449 Chronic obstructive pulmonary disease, unspecified: Secondary | ICD-10-CM

## 2023-04-14 NOTE — Telephone Encounter (Signed)
Medication Refill - Medication: albuterol (VENTOLIN HFA) 108 (90 Base) MCG/ACT inhaler and levothyroxine (SYNTHROID) 75 MCG tablet   Has the patient contacted their pharmacy? No.  Preferred Pharmacy (with phone number or street name):  [facility] [address] [place]), [place] - [address] Phone: [phone]  Fax: [phone]     Has the patient been seen for an appointment in the last year OR does the patient have an upcoming appointment? Yes.    Agent: Please be advised that RX refills may take up to 3 business days. We ask that you follow-up with your pharmacy.

## 2023-04-15 MED ORDER — ALBUTEROL SULFATE HFA 108 (90 BASE) MCG/ACT IN AERS: 2.0000 | INHALATION_SPRAY | Freq: Four times a day (QID) | RESPIRATORY_TRACT | 1 refills | Status: DC | PRN

## 2023-04-15 MED ORDER — LEVOTHYROXINE SODIUM 75 MCG PO TABS: 75.0000 ug | ORAL_TABLET | Freq: Every day | ORAL | 1 refills | Status: AC

## 2023-04-15 NOTE — Telephone Encounter (Signed)
Requested Prescriptions  Pending Prescriptions Disp Refills   albuterol (VENTOLIN HFA) 108 (90 Base) MCG/ACT inhaler 18 g 1    Sig: Inhale 2 puffs into the lungs every 6 (six) hours as needed for wheezing or shortness of breath.     Pulmonology:  Beta Agonists 2 Passed - 04/14/2023 10:47 AM      Passed - Last BP in normal range    BP Readings from Last 1 Encounters:  02/21/23 118/72         Passed - Last Heart Rate in normal range    Pulse Readings from Last 1 Encounters:  02/21/23 87         Passed - Valid encounter within last 12 months    Recent Outpatient Visits           1 month ago Hypothyroidism, unspecified type   [facility] Berniece Salines, FNP   5 months ago Hypothyroidism, unspecified type   [facility] Berniece Salines, FNP       Future Appointments             In 4 months Berniece Salines, FNP [facility]             levothyroxine (SYNTHROID) 75 MCG tablet 90 tablet 1    Sig: Take 1 tablet (75 mcg total) by mouth daily.     Endocrinology:  Hypothyroid Agents Passed - 04/14/2023 10:47 AM      Passed - TSH in normal range and within 360 days    TSH  Date Value Ref Range Status  02/21/2023 1.41 0.40 - 4.50 mIU/L Final         Passed - Valid encounter within last 12 months    Recent Outpatient Visits           1 month ago Hypothyroidism, unspecified type   [facility] Berniece Salines, FNP   5 months ago Hypothyroidism, unspecified type   [facility] Berniece Salines, FNP       Future Appointments             In 4 months Zane Herald, Rudolpho Sevin, FNP [facility], [facility]

## 2023-05-07 ENCOUNTER — Other Ambulatory Visit: Payer: Self-pay

## 2023-05-07 DIAGNOSIS — F17210 Nicotine dependence, cigarettes, uncomplicated: Secondary | ICD-10-CM

## 2023-05-07 DIAGNOSIS — Z87891 Personal history of nicotine dependence: Secondary | ICD-10-CM

## 2023-05-07 DIAGNOSIS — Z122 Encounter for screening for malignant neoplasm of respiratory organs: Secondary | ICD-10-CM

## 2023-05-16 ENCOUNTER — Encounter: Payer: Self-pay | Admitting: Acute Care

## 2023-05-23 ENCOUNTER — Inpatient Hospital Stay: Admission: RE | Admit: 2023-05-23 | Payer: Self-pay | Source: Ambulatory Visit

## 2023-05-30 ENCOUNTER — Other Ambulatory Visit: Payer: Self-pay | Admitting: Nurse Practitioner

## 2023-05-30 DIAGNOSIS — J449 Chronic obstructive pulmonary disease, unspecified: Secondary | ICD-10-CM

## 2023-05-30 NOTE — Telephone Encounter (Signed)
Requested Prescriptions  Pending Prescriptions Disp Refills   albuterol (VENTOLIN HFA) 108 (90 Base) MCG/ACT inhaler [Pharmacy Med Name: Albuterol Sulfate HFA 108 (90 Base) MCG/ACT Inhalation Aerosol Solution] 18 g 1    Sig: INHALE 2 PUFFS BY MOUTH EVERY 6 HOURS AS NEEDED FOR WHEEZING FOR SHORTNESS OF BREATH     Pulmonology:  Beta Agonists 2 Passed - 05/30/2023  6:51 AM      Passed - Last BP in normal range    BP Readings from Last 1 Encounters:  02/21/23 118/72         Passed - Last Heart Rate in normal range    Pulse Readings from Last 1 Encounters:  02/21/23 87         Passed - Valid encounter within last 12 months    Recent Outpatient Visits           3 months ago Hypothyroidism, unspecified type   [facility] Berniece Salines, FNP   7 months ago Hypothyroidism, unspecified type   [facility] Berniece Salines, FNP       Future Appointments             In 3 months Zane Herald, Rudolpho Sevin, FNP [facility], [facility]

## 2023-07-08 IMAGING — MG MM DIGITAL SCREENING BILAT W/ TOMO AND CAD
6 of 10 series · 6 of 30 positions shown · non-contrast
Comparison: Previous exam(s).

CLINICAL DATA: Screening.

EXAM:
DIGITAL SCREENING BILATERAL MAMMOGRAM WITH TOMOSYNTHESIS AND CAD
TECHNIQUE: Bilateral screening digital craniocaudal and mediolateral oblique
mammograms were obtained. Bilateral screening digital breast
tomosynthesis was performed. The images were evaluated with
computer-aided detection.

[L CC synth-2D]
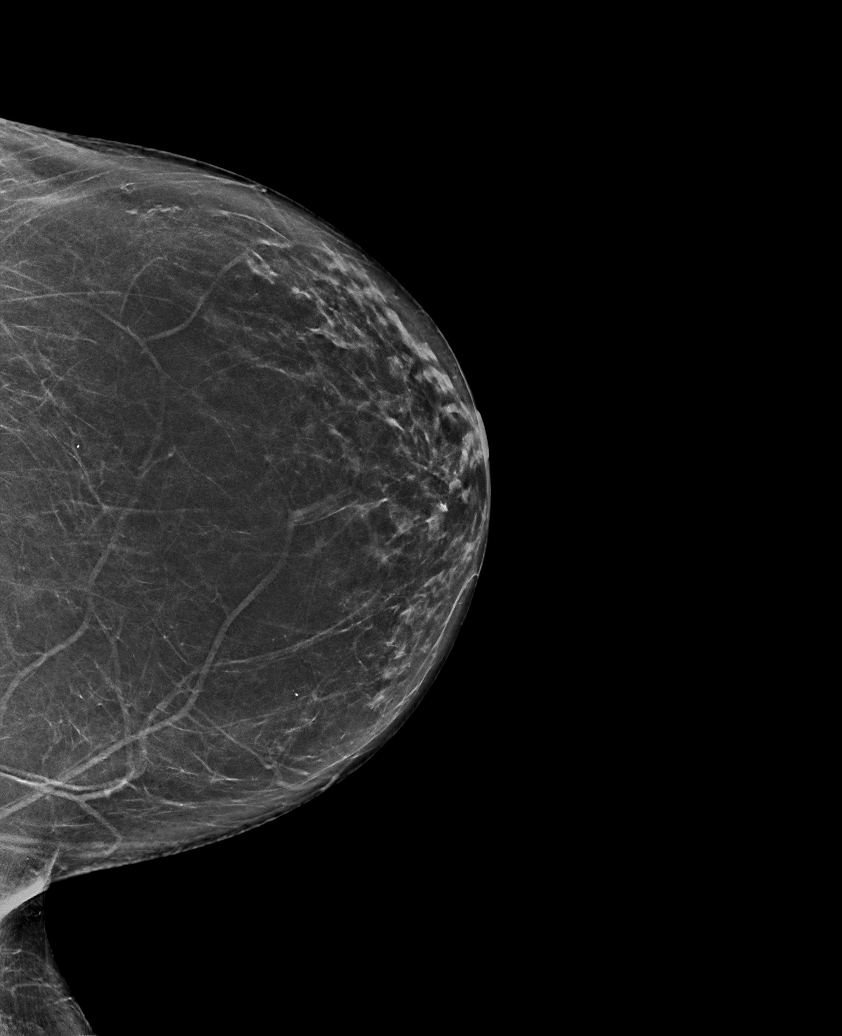

[L MLO synth-2D]
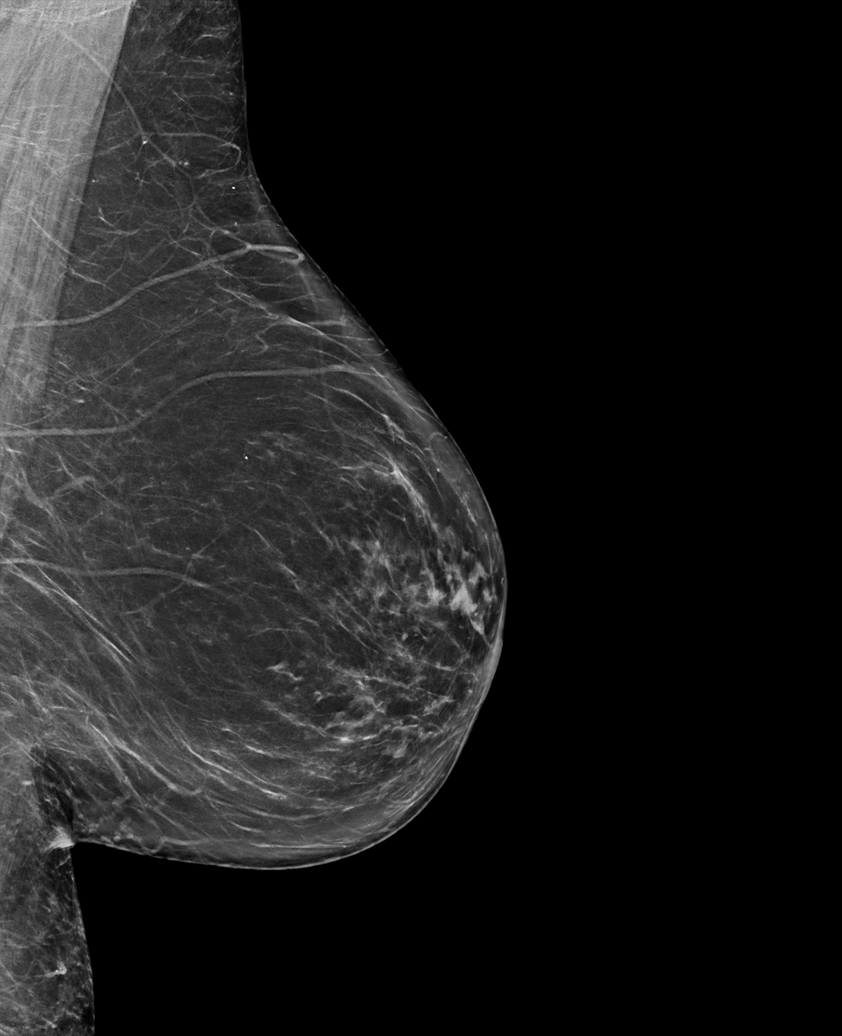

[R MLO synth-2D (1 of 2)]
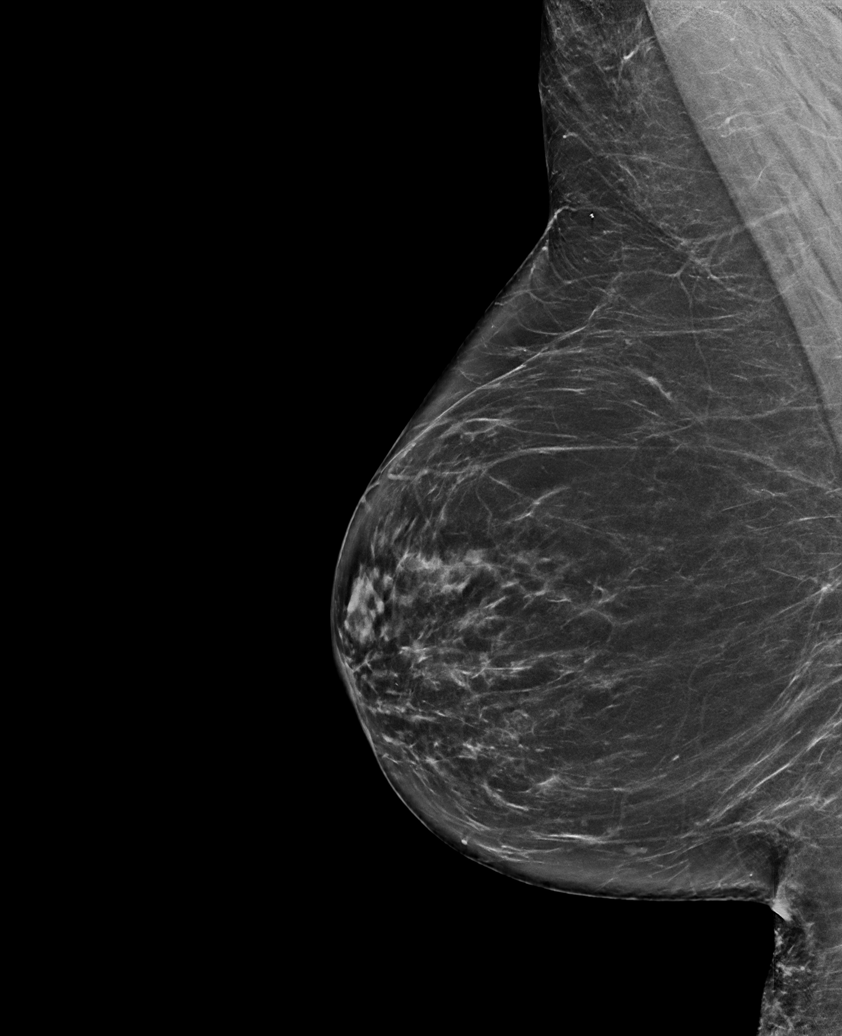

[R MLO synth-2D (2 of 2)]
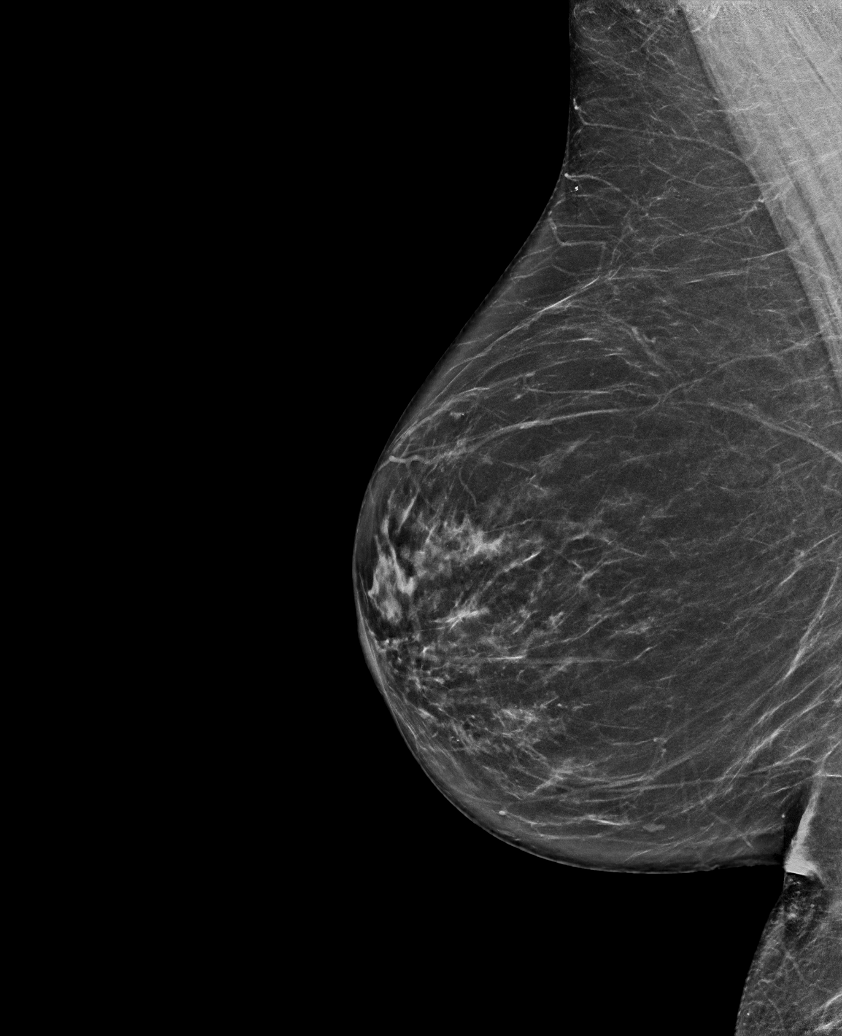

[R CC synth-2D]
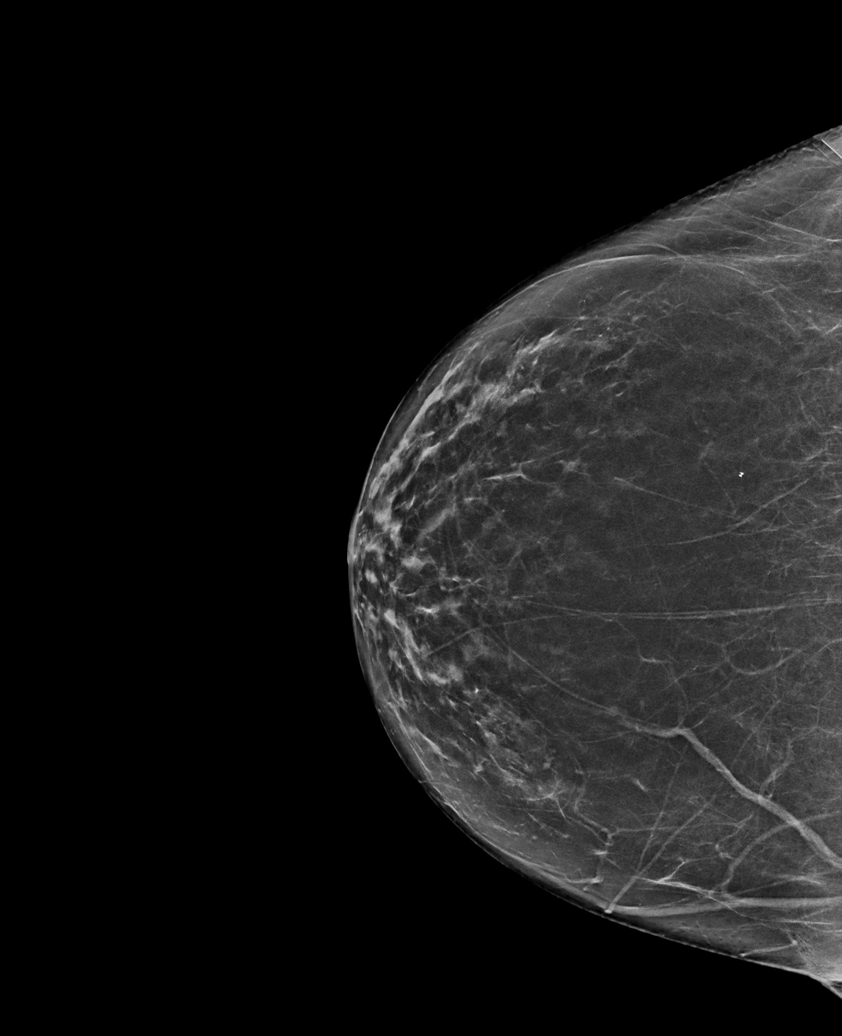

[L MLO tomo · tomo slice 38/75.0]
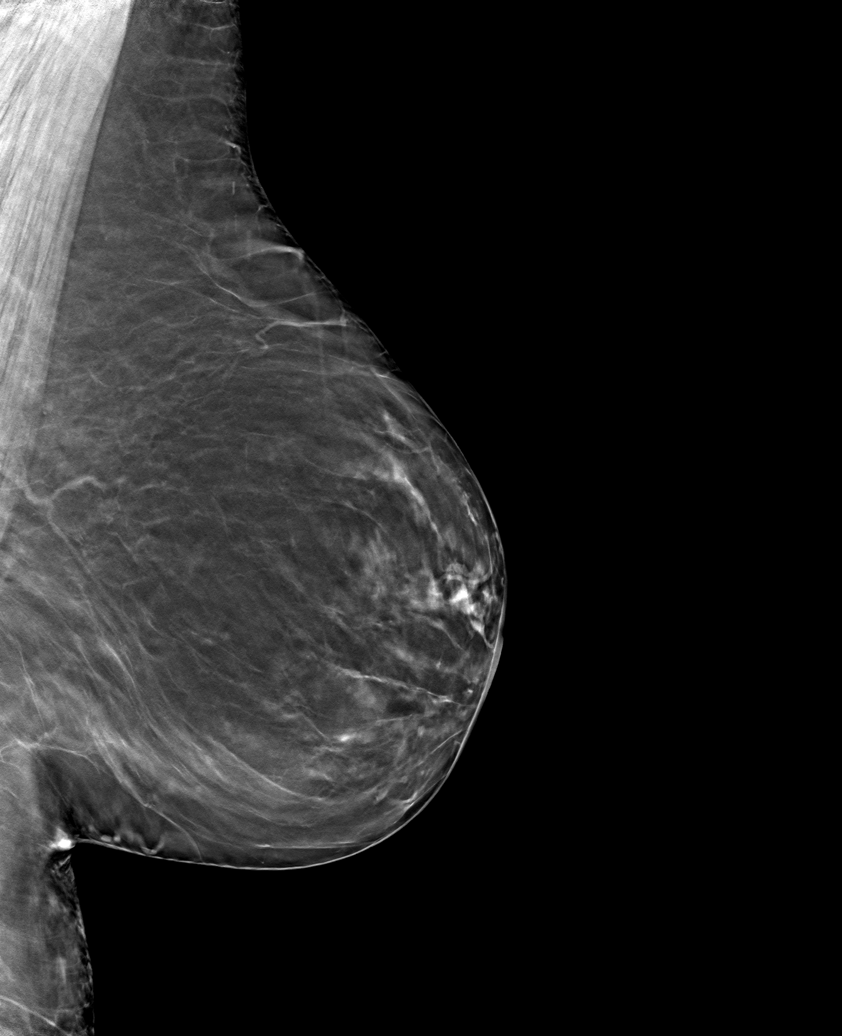

[6 of 30 positions shown; findings below may reference images not displayed]

ACR Breast Density Category b: There are scattered areas of
fibroglandular density.
FINDINGS: There are no findings suspicious for malignancy.
IMPRESSION: No mammographic evidence of malignancy. A result letter of this
screening mammogram will be mailed directly to the patient.

RECOMMENDATION:
Screening mammogram in one year. (Code:51-O-LD2)

BI-RADS CATEGORY  1: Negative.

## 2023-07-25 ENCOUNTER — Ambulatory Visit: Payer: Self-pay

## 2023-08-29 ENCOUNTER — Ambulatory Visit
Admission: RE | Admit: 2023-08-29 | Discharge: 2023-08-29 | Disposition: A | Discharge status: Home / Self Care | Payer: No Typology Code available for payment source | Source: Ambulatory Visit | Attending: Acute Care | Admitting: Acute Care

## 2023-08-29 ENCOUNTER — Ambulatory Visit: Payer: 59 | Admitting: Nurse Practitioner

## 2023-08-29 DIAGNOSIS — Z87891 Personal history of nicotine dependence: Secondary | ICD-10-CM

## 2023-08-29 DIAGNOSIS — F17210 Nicotine dependence, cigarettes, uncomplicated: Secondary | ICD-10-CM

## 2023-08-29 DIAGNOSIS — Z122 Encounter for screening for malignant neoplasm of respiratory organs: Secondary | ICD-10-CM

## 2023-09-15 ENCOUNTER — Other Ambulatory Visit: Payer: Self-pay

## 2023-09-15 DIAGNOSIS — Z122 Encounter for screening for malignant neoplasm of respiratory organs: Secondary | ICD-10-CM

## 2023-09-15 DIAGNOSIS — Z87891 Personal history of nicotine dependence: Secondary | ICD-10-CM

## 2023-09-15 DIAGNOSIS — F17210 Nicotine dependence, cigarettes, uncomplicated: Secondary | ICD-10-CM

## 2023-10-13 ENCOUNTER — Other Ambulatory Visit: Payer: Self-pay | Admitting: Family Medicine

## 2023-10-13 DIAGNOSIS — Z1231 Encounter for screening mammogram for malignant neoplasm of breast: Secondary | ICD-10-CM

## 2023-10-24 ENCOUNTER — Ambulatory Visit (HOSPITAL_BASED_OUTPATIENT_CLINIC_OR_DEPARTMENT_OTHER): Admission: RE | Admit: 2023-10-24 | Payer: Self-pay | Source: Ambulatory Visit

## 2023-11-03 ENCOUNTER — Ambulatory Visit
Admission: RE | Admit: 2023-11-03 | Discharge: 2023-11-03 | Disposition: A | Discharge status: Home / Self Care | Payer: Self-pay | Source: Ambulatory Visit | Attending: Family Medicine | Admitting: Family Medicine

## 2023-11-03 DIAGNOSIS — Z1231 Encounter for screening mammogram for malignant neoplasm of breast: Secondary | ICD-10-CM | POA: Insufficient documentation

## 2024-07-30 ENCOUNTER — Encounter: Admitting: Nurse Practitioner

## 2024-09-14 ENCOUNTER — Ambulatory Visit (HOSPITAL_BASED_OUTPATIENT_CLINIC_OR_DEPARTMENT_OTHER): Payer: Self-pay
